# Patient Record
Sex: Female | Born: 1970
Health system: Southern US, Community
[De-identification: ages and names within clinical notes are randomized; demographics above are authoritative.]

## PROBLEM LIST (undated history)

## (undated) DIAGNOSIS — T7840XA Allergy, unspecified, initial encounter: Secondary | ICD-10-CM

## (undated) DIAGNOSIS — D649 Anemia, unspecified: Secondary | ICD-10-CM

## (undated) DIAGNOSIS — F410 Panic disorder [episodic paroxysmal anxiety] without agoraphobia: Secondary | ICD-10-CM

## (undated) DIAGNOSIS — F419 Anxiety disorder, unspecified: Secondary | ICD-10-CM

## (undated) DIAGNOSIS — G43109 Migraine with aura, not intractable, without status migrainosus: Secondary | ICD-10-CM

## (undated) DIAGNOSIS — K8020 Calculus of gallbladder without cholecystitis without obstruction: Secondary | ICD-10-CM

## (undated) HISTORY — DX: Calculus of gallbladder without cholecystitis without obstruction: K80.20

## (undated) HISTORY — DX: Anxiety disorder, unspecified: F41.9

## (undated) HISTORY — DX: Migraine with aura, not intractable, without status migrainosus: G43.109

## (undated) HISTORY — PX: CHOLECYSTECTOMY: SHX55

## (undated) HISTORY — DX: Allergy, unspecified, initial encounter: T78.40XA

---
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
## 1998-10-12 DIAGNOSIS — T7840XA Allergy, unspecified, initial encounter: Secondary | ICD-10-CM

## 1998-10-12 HISTORY — DX: Allergy, unspecified, initial encounter: T78.40XA

## 2003-12-31 ENCOUNTER — Ambulatory Visit: Payer: Self-pay | Admitting: Family Medicine

## 2004-12-14 DIAGNOSIS — F419 Anxiety disorder, unspecified: Secondary | ICD-10-CM

## 2004-12-14 HISTORY — DX: Anxiety disorder, unspecified: F41.9

## 2005-01-20 ENCOUNTER — Ambulatory Visit: Payer: Self-pay | Admitting: Family Medicine

## 2005-01-24 ENCOUNTER — Ambulatory Visit: Payer: Self-pay | Admitting: Family Medicine

## 2005-02-03 ENCOUNTER — Ambulatory Visit: Payer: Self-pay | Admitting: Family Medicine

## 2005-07-13 ENCOUNTER — Ambulatory Visit: Payer: Self-pay | Admitting: Family Medicine

## 2005-08-10 ENCOUNTER — Ambulatory Visit: Payer: Self-pay | Admitting: Family Medicine

## 2005-08-14 ENCOUNTER — Ambulatory Visit: Payer: Self-pay | Admitting: Family Medicine

## 2005-09-26 ENCOUNTER — Ambulatory Visit: Payer: Self-pay | Admitting: Family Medicine

## 2006-01-02 ENCOUNTER — Ambulatory Visit: Payer: Self-pay | Admitting: Family Medicine

## 2006-05-06 ENCOUNTER — Encounter: Payer: Self-pay | Admitting: Family Medicine

## 2006-05-06 DIAGNOSIS — J309 Allergic rhinitis, unspecified: Secondary | ICD-10-CM | POA: Insufficient documentation

## 2006-05-06 DIAGNOSIS — F411 Generalized anxiety disorder: Secondary | ICD-10-CM | POA: Insufficient documentation

## 2007-01-01 ENCOUNTER — Ambulatory Visit: Payer: Self-pay | Admitting: Family Medicine

## 2007-01-24 ENCOUNTER — Encounter: Payer: Self-pay | Admitting: Maternal & Fetal Medicine

## 2007-07-29 ENCOUNTER — Emergency Department: Payer: Self-pay | Admitting: Emergency Medicine

## 2007-08-02 ENCOUNTER — Inpatient Hospital Stay: Payer: Self-pay

## 2007-08-02 ENCOUNTER — Observation Stay: Payer: Self-pay | Admitting: Obstetrics and Gynecology

## 2008-04-30 ENCOUNTER — Telehealth: Payer: Self-pay | Admitting: Family Medicine

## 2008-10-16 ENCOUNTER — Ambulatory Visit: Payer: Self-pay | Admitting: Family Medicine

## 2008-10-16 DIAGNOSIS — J029 Acute pharyngitis, unspecified: Secondary | ICD-10-CM | POA: Insufficient documentation

## 2008-10-16 LAB — CONVERTED CEMR LAB: Rapid Strep: NEGATIVE

## 2008-10-17 ENCOUNTER — Encounter: Payer: Self-pay | Admitting: Family Medicine

## 2009-04-01 ENCOUNTER — Ambulatory Visit: Payer: Self-pay | Admitting: Obstetrics and Gynecology

## 2009-07-15 ENCOUNTER — Ambulatory Visit: Payer: Self-pay | Admitting: Family Medicine

## 2009-07-16 ENCOUNTER — Telehealth: Payer: Self-pay | Admitting: Family Medicine

## 2009-09-21 ENCOUNTER — Encounter (INDEPENDENT_AMBULATORY_CARE_PROVIDER_SITE_OTHER): Payer: Self-pay | Admitting: *Deleted

## 2010-02-25 ENCOUNTER — Ambulatory Visit
Admission: RE | Admit: 2010-02-25 | Discharge: 2010-02-25 | Payer: Self-pay | Source: Home / Self Care | Attending: Family Medicine | Admitting: Family Medicine

## 2010-02-25 DIAGNOSIS — J0110 Acute frontal sinusitis, unspecified: Secondary | ICD-10-CM | POA: Insufficient documentation

## 2010-03-15 NOTE — Progress Notes (Signed)
Summary: wants antibiotic  Phone Note Call from Patient Call back at [phone]   Caller: Patient Call For: Dr.Josafat Enrico Summary of Call: Pt was seen yesterday for possible strep and she is not feeling any better today.  She is asking for culture results but they arent back yet so she is asking if she can get an antibiotic to take through the week end.  She uses [facility] on [address]  Please let her know, leave message if she doesnt answer. Initial call taken by: Lowella Petties CMA,  July 16, 2009 3:45 PM    New/Updated Medications: PENICILLIN V POTASSIUM 500 MG TABS (PENICILLIN V POTASSIUM) 1 tab by mouth three times a day x 10 days Prescriptions: PENICILLIN V POTASSIUM 500 MG TABS (PENICILLIN V POTASSIUM) 1 tab by mouth three times a day x 10 days  #30 x 0   Entered and Authorized by:   Ruthe Mannan MD   Signed by:   Ruthe Mannan MD on 07/16/2009   Method used:   Electronically to        [facility]. [address] [phone]* (retail)       [address] [place]  [address]       Ph: [phone]       Fax: [phone]   RxID:   [id number]   Appended Document: wants antibiotic Patient advised as instructed via telephone.

## 2010-03-15 NOTE — Letter (Signed)
Summary: Nadara Eaton letter  [place] [facility]  [address] [place], [place] [address]   Phone: [phone]  Fax: [phone]       09/21/2009 MRN: [id number]  [facility] [address] [place], [place]  [address]  Dear Ms. Moorhouse,  [facility] [place], and [place] announce the retirement of Arta Silence, M.D., from full-time practice at the [facility] office effective August 12, 2009 and his plans of returning part-time.  It is important to Dr. Hetty Ely and to our practice that you understand that [facility] has seven physicians in our office for your health care needs.  We will continue to offer the same exceptional care that you have today.    Dr. Hetty Ely has spoken to many of you about his plans for retirement and returning part-time in the fall.   We will continue to work with you through the transition to schedule appointments for you in the office and meet the high standards that [facility] is committed to.   Again, it is with great pleasure that we share the news that Dr. Hetty Ely will return to [facility] in October of 2011 with a reduced schedule.    If you have any questions, or would like to request an appointment with one of our physicians, please call us at [phone] and press the option for Scheduling an appointment.  We take pleasure in providing you with excellent patient care and look forward to seeing you at your next office visit.  Our [facility] Physicians are:  Tillman Abide, M.D. Laurita Quint, M.D. Roxy Manns, M.D. Kerby Nora, M.D. Hannah Beat, M.D. Ruthe Mannan, M.D. We proudly welcomed Raechel Ache, M.D. and Eustaquio Boyden, M.D. to the practice in July/August 2011.  Sincerely,  [facility]

## 2010-03-15 NOTE — Assessment & Plan Note (Signed)
Summary: ST/CLE   Vital Signs:  Patient profile:   40 year old female Height:      65 inches Weight:      164 pounds BMI:     27.39 Temp:     97.9 degrees F oral Pulse rate:   84 / minute Pulse rhythm:   regular BP sitting:   132 / 76  (left arm) Cuff size:   regular  Vitals Entered By: Liane Comber CMA Duncan Dull) (October 16, 2008 11:34 AM)  History of Present Illness: was out of town last weekend  woke up on sunday with sore throat    (she has chronic sinus problems) hurts to swallow sore on sides of neck some thick mucuos with blood from her nose  has not felt feverish   does have allergies  thick and stuffy in nose  sinus pain above eyes ears are hurting  also some pain in back of head     Allergies: No Known Drug Allergies  Past History:  Past Medical History: Last updated: 05/06/2006 Allergic rhinitis 10/12/1998 Anxiety 12/14/2004  Past Surgical History: Last updated: 05/06/2006 NBSVDx2 Appendectomy 1987  Risk Factors: Smoking Status: current (05/06/2006) Packs/Day: 1/2 to 1 (05/06/2006)  Review of Systems General:  Complains of fever and malaise. Eyes:  Complains of discharge; a littl discharge . ENT:  Complains of earache, nasal congestion, postnasal drainage, sinus pressure, and sore throat; denies ear discharge. CV:  Denies chest pain or discomfort and palpitations. Resp:  Complains of cough; little bad cough- not bad . GI:  Denies diarrhea, nausea, and vomiting. MS:  Denies joint pain. Derm:  Denies insect bite(s), itching, lesion(s), and rash.  Physical Exam  General:  well appearing but fatigued  Head:  normocephalic, atraumatic, and no abnormalities observed.   tender ethmoid and maxillary sinus tenderness  Eyes:  vision grossly intact, pupils equal, pupils round, pupils reactive to light, and no injection.   Ears:  R ear normal and L ear normal.   Nose:  nares are mildly injected and congested Mouth:  throat is erythematous  some  clear post nasal drip no swelling or exudate  Neck:  No deformities, masses, or tenderness noted. Chest Wall:  No deformities, masses, or tenderness noted. Lungs:  Normal respiratory effort, chest expands symmetrically. Lungs are clear to auscultation, no crackles or wheezes. Heart:  Normal rate and regular rhythm. S1 and S2 normal without gallop, murmur, click, rub or other extra sounds. Abdomen:  soft and non-tender.   Msk:  no acute joint changes  Skin:  Intact without suspicious lesions or rashes Cervical Nodes:  No lymphadenopathy noted Psych:  normal affect, talkative and pleasant    Impression & Recommendations:  Problem # 1:  SORE THROAT (ICD-462) Assessment New  with sinus pain and pressure and some drainage  rapid strep neg- but quite erythematous on exam  will cover with augmentin - get throat cx update if worse or not imp recommend sympt care- see pt instructions   Her updated medication list for this problem includes:    Augmentin 875-125 Mg Tabs (Amoxicillin-pot clavulanate) .Marland Kitchen... 1 by mouth two times a day for 10 days  Orders: T-Culture, Throat ([address]29-52841) Rapid Strep (324[address])  Complete Medication List: 1)  Zoloft 25 Mg Tabs (Sertraline hcl) .... 1/2-1 tab by mouth qd 2)  Augmentin 875-125 Mg Tabs (Amoxicillin-pot clavulanate) .Marland Kitchen.. 1 by mouth two times a day for 10 days 3)  Seroquel 25 Mg Tabs (Quetiapine fumarate) .Marland Kitchen.. 1 by mouth at  bedtime 4)  Klonopin 0.5 Mg Tabs (Clonazepam) .... 1/2 once daily as needed, 1 by mouth at bedtime  Patient Instructions: 1)  I am sending throat culture - will update when it returns  2)  tylenol as needed for throat pain/ chloraseptic spray  3)  robitussin or mucinex for congestion - lots of fluids 4)  update me if wose or fever  5)  take augmentin as directed  Prescriptions: AUGMENTIN 875-125 MG TABS (AMOXICILLIN-POT CLAVULANATE) 1 by mouth two times a day for 10 days  #20 x 0    Entered and Authorized by:   Judith Part  MD   Signed by:   Judith Part MD on 10/16/2008   Method used:   Print then Give to Patient   RxID:   [id number]   Prior Medications (reviewed today): ZOLOFT 25 MG  TABS (SERTRALINE HCL) 1/2-1 tab by mouth qd AUGMENTIN 875-125 MG TABS (AMOXICILLIN-POT CLAVULANATE) 1 by mouth two times a day for 10 days SEROQUEL 25 MG TABS (QUETIAPINE FUMARATE) 1 by mouth at bedtime KLONOPIN 0.5 MG TABS (CLONAZEPAM) 1/2 once daily as needed, 1 by mouth at bedtime Current Allergies (reviewed today): No known allergies  Current Medications (including changes made in today's visit):  ZOLOFT 25 MG  TABS (SERTRALINE HCL) 1/2-1 tab by mouth qd AUGMENTIN 875-125 MG TABS (AMOXICILLIN-POT CLAVULANATE) 1 by mouth two times a day for 10 days SEROQUEL 25 MG TABS (QUETIAPINE FUMARATE) 1 by mouth at bedtime KLONOPIN 0.5 MG TABS (CLONAZEPAM) 1/2 once daily as needed, 1 by mouth at bedtime   Laboratory Results  Date/Time Received: October 16, 2008 11:35 AM Date/Time Reported: October 16, 2008 11:35 AM  Other Tests  Rapid Strep: negative  Kit Test Internal QC: Positive   (Normal Range: Negative)

## 2010-03-15 NOTE — Assessment & Plan Note (Signed)
Summary: STREP??   Vital Signs:  Patient profile:   40 year old female Height:      65 inches Weight:      163.25 pounds BMI:     27.26 Temp:     98.7 degrees F oral Pulse rate:   72 / minute Pulse rhythm:   regular BP sitting:   150 / 80  (left arm) Cuff size:   regular  Vitals Entered By: Linde Gillis CMA Duncan Dull) (July 15, 2009 12:02 PM) CC: sore throat   History of Present Illness: 40 yo here for for ? strep throat.  Son has strep throat. Woke up with morning with sore throat and headache. No fevers, chills, nausea or vomiting. No rash or abdominal pain. No difficulty swallowing. No ear pain. No cough or runny nose.   Taking Ibuprofen which is helping with symptoms.  PMH consistent with Group B strep- see throat culture from 10/2008.  Current Medications (verified): 1)  Zoloft 25 Mg  Tabs (Sertraline Hcl) .... 1/2-1 Tab By Mouth Qd 2)  Seroquel 25 Mg Tabs (Quetiapine Fumarate) .Marland Kitchen.. 1 By Mouth At Bedtime 3)  Klonopin 0.5 Mg Tabs (Clonazepam) .... 1/2 Once Daily As Needed, 1 By Mouth At Bedtime  Allergies (verified): No Known Drug Allergies  Review of Systems      See HPI General:  Denies fever. ENT:  Denies difficulty swallowing. Resp:  Denies cough.  Physical Exam  General:  well appearing but fatigued  Mouth:  throat is erythematous  some clear post nasal drip no swelling or exudate  Lungs:  Normal respiratory effort, chest expands symmetrically. Lungs are clear to auscultation, no crackles or wheezes. Heart:  Normal rate and regular rhythm. S1 and S2 normal without gallop, murmur, click, rub or other extra sounds. Psych:  normal affect, talkative and pleasant    Impression & Recommendations:  Problem # 1:  SORE THROAT (ICD-462) Assessment New Rapid strep negative but given her past history of GBS (see emr) and son's strep throat diagnosis, will send for culture. The following medications were removed from the medication list:    Augmentin 875-125  Mg Tabs (Amoxicillin-pot clavulanate) .Marland Kitchen... 1 by mouth two times a day for 10 days  Orders: Rapid Strep ([address]) T-Culture, Throat ([id number])  Complete Medication List: 1)  Zoloft 25 Mg Tabs (Sertraline hcl) .... 1/2-1 tab by mouth qd 2)  Seroquel 25 Mg Tabs (Quetiapine fumarate) .Marland Kitchen.. 1 by mouth at bedtime 3)  Klonopin 0.5 Mg Tabs (Clonazepam) .... 1/2 once daily as needed, 1 by mouth at bedtime  Current Allergies (reviewed today): No known allergies

## 2010-03-15 NOTE — Progress Notes (Signed)
Summary: problems with allergies  Phone Note Call from Patient Call back at Home Phone [phone] Call back at [phone]   Caller: Patient Call For: Shaune Leeks MD Summary of Call: Pt is having a bad time with allergies and she is asking what she can take with the other meds that she takes.  She takes zoloft, seroquil, klonopin.  Uses [facility] [address] Please advise. Initial call taken by: Lowella Petties,  April 30, 2008 3:38 PM  Follow-up for Phone Call        Suggest Claritin, Allegra or Zyrtec or their generics. Suggest first two in AM, latter in PM. If not enough to control sxs in 7-10 days to get a blood level, come in but don't stop the Claritin, Allegra or Zyrtec. Follow-up by: Shaune Leeks MD,  April 30, 2008 4:34 PM  Additional Follow-up for Phone Call Additional follow up Details #1::        Advised pt. Additional Follow-up by: Lowella Petties,  April 30, 2008 4:44 PM

## 2010-03-15 NOTE — Assessment & Plan Note (Signed)
Summary: DISCUSS MEDICATION/CLE  Medications Added ALPRAZOLAM 0.5 MG  TABS (ALPRAZOLAM) three times a day PRN ZOLOFT 25 MG  TABS (SERTRALINE HCL) 1 Q O D ZOLOFT 25 MG  TABS (SERTRALINE HCL) 1/2-1 tab by mouth qd      Allergies Added: NKDA  Vital Signs:  Patient Profile:   40 Years Old Female Weight:      167 pounds Temp:     98.9 degrees F oral Pulse rate:   68 / minute Pulse rhythm:   regular BP sitting:   160 / 70  (left arm) Cuff size:   regular  Vitals Entered ByProvidence Crosby (January 01, 2007 12:41 PM)                 Chief Complaint:  DISCUSS ZOLOFT.  History of Present Illness: BP at OB/GYN last week 124/65 and at home 115/65. Pt is pregnant  and is somewhat shocked. Has discussed with Dr Greggory Keen and has been tapering but probably too quickly as she has not felt well since trying to go to 25mg  every other day...went frm 50mg  once daily to 50 altern w/ 25 then 25then 25 every other day, her present dose. Having morning sickness and some familial stress as well.  Mother in law died this year on baby's due date next year.  Current Allergies: No known allergies       Physical Exam  General:     Well-developed,well-nourished,in no acute distress; alert,appropriate and cooperative throughout examination Head:     Normocephalic and atraumatic without obvious abnormalities. No apparent alopecia or balding. Eyes:     Conjunctiva clear bilaterally.  Ears:     External ear exam shows no significant lesions or deformities.  Otoscopic examination reveals clear canals, tympanic membranes are intact bilaterally without bulging, retraction, inflammation or discharge. Hearing is grossly normal bilaterally. Nose:     External nasal examination shows no deformity or inflammation. Nasal mucosa are pink and moist without lesions or exudates. Mouth:     Oral mucosa and oropharynx without lesions or exudates.  Teeth in good repair.    Impression &  Recommendations:  Problem # 1:  ANXIETY (ICD-300.00) Now that pregnant, want to get off SSRI an will more slowly taper The following medications were removed from the medication list:    Zoloft 25 Mg Tabs (Sertraline hcl) .Marland Kitchen... 1 q o d Take 25 once daily for two weks, 25 altn w/ 1/2 25 for two weeks, 1/2 25 once daily for two weeks, 1/2 altn w/ 0 for two weeks and then stop.  Her updated medication list for this problem includes:    Alprazolam 0.5 Mg Tabs (Alprazolam) .Marland Kitchen... Three times a day prn    Zoloft 25 Mg Tabs (Sertraline hcl) .Marland Kitchen... 1/2-1 tab by mouth qd  The following medications were removed from the medication list:    Zoloft 25 Mg Tabs (Sertraline hcl) .Marland Kitchen... 1 q o d  Her updated medication list for this problem includes:    Alprazolam 0.5 Mg Tabs (Alprazolam) .Marland Kitchen... Three times a day prn    Zoloft 25 Mg Tabs (Sertraline hcl) .Marland Kitchen... 1/2-1 tab by mouth qd   Complete Medication List: 1)  Alprazolam 0.5 Mg Tabs (Alprazolam) .... Three times a day prn 2)  Zoloft 25 Mg Tabs (Sertraline hcl) .... 1/2-1 tab by mouth qd   Patient Instructions: 1)  Start slower taper of Zoloft. 2)  RTC as needed     Prescriptions: ZOLOFT 25 MG  TABS (SERTRALINE HCL)  1/2-1 tab by mouth qd  #30 x 1   Entered and Authorized by:   Shaune Leeks MD   Signed by:   Shaune Leeks MD on 01/01/2007   Method used:   Print then Give to Patient   RxID:   [id number]  ]

## 2010-03-17 NOTE — Assessment & Plan Note (Signed)
Summary: ST,EARS,CONGESTION,HA/CLE   Vital Signs:  Patient profile:   40 year old female Height:      65 inches Weight:      177 pounds BMI:     29.56 Temp:     98.2 degrees F oral Pulse rate:   80 / minute Pulse rhythm:   regular BP sitting:   160 / 88  (left arm) Cuff size:   regular  Vitals Entered By: Delilah Shan CMA Duncan Dull) (February 25, 2010 9:09 AM)  Serial Vital Signs/Assessments:  Time      Position  BP       Pulse  Resp  Temp     By                     140/82                         Crawford Givens MD  CC: ST, ears, congestion, headache   History of Present Illness: 1 week ago with sinus pain.  Still with HA over the weekend.  Son has been sick.  Since then pain with swallowing.  neck is sore.  No fevers known.  + sweats.  No sputum, but some post nasal gtt.  Nasal congestion.  Minimal cough.  voice change.  bilateral, R>L ear pain.  Frontal > max pain.  Some mandible pain bilaterally.  Achy all over, typical for patient with an illness.    Current Medications (verified): 1)  Zoloft 100 Mg Tabs (Sertraline Hcl) .Marland Kitchen.. 1 By Mouth Once Daily 2)  Seroquel 25 Mg Tabs (Quetiapine Fumarate) .Marland Kitchen.. 1 By Mouth At Bedtime 3)  Klonopin 0.5 Mg Tabs (Clonazepam) .... 1/2 Once Daily As Needed, 1 By Mouth At Bedtime  Allergies (verified): 1)  ! 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  Otherwise negative.    Physical Exam  General:  GEN: nad, alert and oriented HEENT: mucous membranes moist, TM w/o erythema, nasal epithelium injected, OP with cobblestoning, frontal sinuses tender to palpation  NECK: supple w/o LA CV:  rrr. PULM: ctab, no inc wob ABD: soft, +bs EXT: no edema    Impression & Recommendations:  Problem # 1:  SINUSITIS - ACUTE-NOS (ICD-461.9) Nontoxic.  Start amoxil and supportive tx o/w.  follow up as needed.  She agrees.   The following medications were removed from the medication list:    Penicillin V Potassium 500 Mg Tabs (Penicillin v potassium) .Marland Kitchen... 1 tab by mouth three times a day x 10 days Her updated medication list for this problem includes:    Amoxicillin 875 Mg Tabs (Amoxicillin) .Marland Kitchen... 1 by mouth two times a day  Complete Medication List: 1)  Zoloft 100 Mg Tabs (Sertraline hcl) .Marland Kitchen.. 1 by mouth once daily 2)  Seroquel 25 Mg Tabs (Quetiapine fumarate) .Marland Kitchen.. 1 by mouth at bedtime 3)  Klonopin 0.5 Mg Tabs (Clonazepam) .... 1/2 once daily as needed, 1 by mouth at bedtime 4)  Amoxicillin 875 Mg Tabs (Amoxicillin) .Marland Kitchen.. 1 by mouth two times a day  Patient Instructions: 1)  Get plenty of rest, drink lots of clear  liquids, and use Tylenol or Ibuprofen for fever and comfort. Start the antibiotics today and try to quit smoking.  I would gargle with warm salt water for your throat.  Take care.  Prescriptions: AMOXICILLIN 875 MG TABS (AMOXICILLIN) 1 by mouth two times a day  #20 x 0   Entered and Authorized by:   Crawford Givens MD   Signed by:   Crawford Givens MD on 02/25/2010   Method used:   Print then Give to Patient   RxID:   [id number]    Orders Added: 1)  Est. Patient Level III [14782]    Current Allergies (reviewed today): ! * LATEX

## 2010-07-01 NOTE — Letter (Signed)
January 02, 2006    Sharon Seller, M.D.  [facility], OB-GYN Department  [address]  [place], [place] [address]   RE:  Maria Lawson, Maria Lawson  MRN:  [id number]  /  DOB:  11-Mar-1970   Dear Sharl Ma,   This is to introduce Claire Dolores, a 40 year old white female who is  extremely anxious, who has had spotting for a week and now with left lower  quadrant pelvic pain.  The pain appears to be fairly mild.  She has no known  pregnancy, although she does nothing to avoid pregnancy.  She has not had  any feelings of this, has been pregnant and has delivered in the past.  She  hasn't been seen by gynecology in 12 years, since the delivery of her last  child, which was done by a gynecologist at [facility].  She is  reasonably healthy, other than her extreme anxiety.  She is pretty sure this  is vaginal bleeding and looks healthy today.  Because she hasn't had  screening in quite some time, and I really haven't ever done a pelvic or Pap  smear on her myself, I felt she ought to be seen to start evaluation with  you.   Her past medical history otherwise is benign.  She is on Zoloft 50 mg a day,  has been on Xanax but hasn't taken it in the last three months or so.  She  has NO DRUG ALLERGIES and otherwise is really healthy.   I appreciate your seeing her and I look forward to your evaluation.    Sincerely,     Arta Silence, MD  Electronically Signed   RNS/MedQ  DD: 01/02/2006  DT: 01/02/2006  Job #: [id number]

## 2012-06-27 ENCOUNTER — Encounter: Payer: Self-pay | Admitting: Family Medicine

## 2012-06-28 ENCOUNTER — Ambulatory Visit (INDEPENDENT_AMBULATORY_CARE_PROVIDER_SITE_OTHER): Payer: BC Managed Care – PPO | Admitting: Family Medicine

## 2012-06-28 ENCOUNTER — Encounter: Payer: Self-pay | Admitting: Family Medicine

## 2012-06-28 VITALS — BP 144/78 | HR 96 | Temp 98.4°F | Wt 207.0 lb

## 2012-06-28 DIAGNOSIS — J019 Acute sinusitis, unspecified: Secondary | ICD-10-CM

## 2012-06-28 DIAGNOSIS — J020 Streptococcal pharyngitis: Secondary | ICD-10-CM

## 2012-06-28 LAB — POCT RAPID STREP A (OFFICE): Rapid Strep A Screen: NEGATIVE

## 2012-06-28 MED ORDER — AMOXICILLIN-POT CLAVULANATE 875-125 MG PO TABS: 1.0000 | ORAL_TABLET | Freq: Two times a day (BID) | ORAL | Status: DC

## 2012-06-28 NOTE — Progress Notes (Signed)
Started with sx about 12 days ago. First noted nasal congestion and ST.  Since then with cough, waking with postnasal gtt. Sinus pressure (this isn't uncommon for her).  "I just don't feel well. She was prev possibly improving, but then the sx worsened again. Smoker, contemplative about cessation, discussed.  Upper teeth are sore.    Meds, vitals, and allergies reviewed.   ROS: See HPI.  Otherwise, noncontributory.  GEN: nad, alert and oriented HEENT: mucous membranes moist, tm w/o erythema, nasal exam w/o erythema, clear discharge noted,  OP with cobblestoning, frontal sinuses ttp B NECK: supple w/o LA CV: rrr.   PULM: ctab, no inc wob EXT: no edema

## 2012-06-28 NOTE — Patient Instructions (Addendum)
Start the antibiotics today and this should gradually improve.  Take care.  Drink plenty of fluids.

## 2012-06-30 NOTE — Assessment & Plan Note (Signed)
Start augmentin, fu prn. Nontoxic.  D/w pt about supportive tx o/w.  She agrees.

## 2013-08-06 ENCOUNTER — Telehealth: Payer: Self-pay | Admitting: Family Medicine

## 2013-08-06 NOTE — Telephone Encounter (Signed)
Patient returned your call.

## 2013-12-15 ENCOUNTER — Ambulatory Visit: Payer: BC Managed Care – PPO | Admitting: Family Medicine

## 2013-12-18 ENCOUNTER — Ambulatory Visit (INDEPENDENT_AMBULATORY_CARE_PROVIDER_SITE_OTHER): Payer: Commercial Managed Care - PPO | Admitting: Family Medicine

## 2013-12-18 ENCOUNTER — Encounter: Payer: Self-pay | Admitting: Family Medicine

## 2013-12-18 VITALS — BP 160/80 | HR 99 | Temp 98.6°F | Wt 216.0 lb

## 2013-12-18 DIAGNOSIS — R1084 Generalized abdominal pain: Secondary | ICD-10-CM

## 2013-12-18 DIAGNOSIS — R195 Other fecal abnormalities: Secondary | ICD-10-CM

## 2013-12-18 LAB — CBC WITH DIFFERENTIAL/PLATELET
Basophils Absolute: 0 10*3/uL (ref 0.0–0.1)
Basophils Relative: 0.6 % (ref 0.0–3.0)
Eosinophils Absolute: 0.1 10*3/uL (ref 0.0–0.7)
Eosinophils Relative: 1.2 % (ref 0.0–5.0)
HCT: 42.7 % (ref 36.0–46.0)
Hemoglobin: 14.1 g/dL (ref 12.0–15.0)
Lymphocytes Relative: 18.6 % (ref 12.0–46.0)
Lymphs Abs: 1.6 10*3/uL (ref 0.7–4.0)
MCHC: 33.1 g/dL (ref 30.0–36.0)
MCV: 85.6 fl (ref 78.0–100.0)
Monocytes Absolute: 0.5 10*3/uL (ref 0.1–1.0)
Monocytes Relative: 5.7 % (ref 3.0–12.0)
Neutro Abs: 6.3 10*3/uL (ref 1.4–7.7)
Neutrophils Relative %: 73.9 % (ref 43.0–77.0)
Platelets: 270 10*3/uL (ref 150.0–400.0)
RBC: 4.99 Mil/uL (ref 3.87–5.11)
RDW: 12.8 % (ref 11.5–15.5)
WBC: 8.5 10*3/uL (ref 4.0–10.5)

## 2013-12-18 LAB — COMPREHENSIVE METABOLIC PANEL
ALT: 17 U/L (ref 0–35)
AST: 15 U/L (ref 0–37)
Albumin: 4 g/dL (ref 3.5–5.2)
Alkaline Phosphatase: 65 U/L (ref 39–117)
BUN: 6 mg/dL (ref 6–23)
CO2: 26 mEq/L (ref 19–32)
Calcium: 9.4 mg/dL (ref 8.4–10.5)
Chloride: 102 mEq/L (ref 96–112)
Creatinine, Ser: 0.8 mg/dL (ref 0.4–1.2)
GFR: 84.99 mL/min (ref >60.00)
Glucose, Bld: 87 mg/dL (ref 70–99)
Potassium: 3.9 mEq/L (ref 3.5–5.1)
Sodium: 137 mEq/L (ref 135–145)
Total Bilirubin: 0.5 mg/dL (ref 0.2–1.2)
Total Protein: 7.6 g/dL (ref 6.0–8.3)

## 2013-12-18 NOTE — Progress Notes (Signed)
Pre visit review using our clinic review tool, if applicable. No additional management support is needed unless otherwise documented below in the visit note.  Longstanding loose stools.  Fecal urgency after meals.  That is her baseline.  Then she'll have occ episodes with back pain, abd burning and back pain near the R shoulder blade and pain near the R hip.  Those episodes can last a few days.  She'll have a lot of gas with those episodes.  She had one recently, but is some better now.    No clear trigger.  No food triggers.  She hasn't done a food diary yet.    No fever, chills, no blood in stool.  No black stools. She has episodic yellow stools during the "bad episodes."  No vomiting, but some nausea.  H/o appendectomy.   No jaundice.  She has some occ RUQ pain.  No burning with urination.    No FH of GI illness.    Meds, vitals, and allergies reviewed.   ROS: See HPI.  Otherwise, noncontributory.  GEN: nad, alert and oriented HEENT: mucous membranes moist NECK: supple w/o LA CV: rrr.  no murmur PULM: ctab, no inc wob, abd not ttp today on exam.  ABD: soft, +bs EXT: no edema SKIN: no acute rash

## 2013-12-18 NOTE — Patient Instructions (Signed)
Go to the lab on the way out.  We'll contact you with your lab report. Keep a food diary to try to ID some triggers.  Maria Lawson will call about your ultrasound.

## 2013-12-19 ENCOUNTER — Ambulatory Visit: Payer: Self-pay | Admitting: Family Medicine

## 2013-12-19 DIAGNOSIS — R195 Other fecal abnormalities: Secondary | ICD-10-CM | POA: Insufficient documentation

## 2013-12-19 NOTE — Assessment & Plan Note (Signed)
Longstanding loose stools.  Fecal urgency after meals.  That is her baseline. She'll have occ episodes with back pain and yellow stools.  Unclear if she has two issues, ie IBS at baseline with occ flare of GB sx.   D/w pt.   Check basic labs today and then proceed with abd u/s.  Check IFOB. If unremarkable, consider GI referral vs celiac testing, etc.  She is going to start a food diary to look for offending agents.  Okay for outpatient f/u.

## 2013-12-22 ENCOUNTER — Encounter: Payer: Self-pay | Admitting: Family Medicine

## 2013-12-26 ENCOUNTER — Other Ambulatory Visit (INDEPENDENT_AMBULATORY_CARE_PROVIDER_SITE_OTHER): Payer: Commercial Managed Care - PPO

## 2013-12-26 DIAGNOSIS — R1084 Generalized abdominal pain: Secondary | ICD-10-CM

## 2013-12-26 LAB — FECAL OCCULT BLOOD, IMMUNOCHEMICAL: Fecal Occult Bld: NEGATIVE

## 2013-12-29 ENCOUNTER — Encounter: Payer: Self-pay | Admitting: *Deleted

## 2014-01-28 DIAGNOSIS — F4329 Adjustment disorder with other symptoms: Secondary | ICD-10-CM | POA: Insufficient documentation

## 2015-02-01 ENCOUNTER — Encounter: Payer: Self-pay | Admitting: Family Medicine

## 2015-02-01 ENCOUNTER — Telehealth: Payer: Self-pay | Admitting: Family Medicine

## 2015-02-01 ENCOUNTER — Ambulatory Visit (INDEPENDENT_AMBULATORY_CARE_PROVIDER_SITE_OTHER): Payer: BLUE CROSS/BLUE SHIELD | Admitting: Family Medicine

## 2015-02-01 VITALS — BP 150/90 | HR 85 | Temp 98.0°F | Ht 66.0 in | Wt 225.8 lb

## 2015-02-01 DIAGNOSIS — IMO0001 Reserved for inherently not codable concepts without codable children: Secondary | ICD-10-CM

## 2015-02-01 DIAGNOSIS — Z0000 Encounter for general adult medical examination without abnormal findings: Secondary | ICD-10-CM

## 2015-02-01 DIAGNOSIS — R030 Elevated blood-pressure reading, without diagnosis of hypertension: Secondary | ICD-10-CM

## 2015-02-01 DIAGNOSIS — F411 Generalized anxiety disorder: Secondary | ICD-10-CM

## 2015-02-01 DIAGNOSIS — Z7189 Other specified counseling: Secondary | ICD-10-CM

## 2015-02-01 LAB — LIPID PANEL
Cholesterol: 202 mg/dL — ABNORMAL HIGH (ref 0–200)
HDL: 50.3 mg/dL (ref >39.00)
LDL Cholesterol: 118 mg/dL — ABNORMAL HIGH (ref 0–99)
NonHDL: 151.21
Total CHOL/HDL Ratio: 4
Triglycerides: 165 mg/dL — ABNORMAL HIGH (ref 0.0–149.0)
VLDL: 33 mg/dL (ref 0.0–40.0)

## 2015-02-01 LAB — BASIC METABOLIC PANEL
BUN: 8 mg/dL (ref 6–23)
CO2: 28 mEq/L (ref 19–32)
Calcium: 9.6 mg/dL (ref 8.4–10.5)
Chloride: 100 mEq/L (ref 96–112)
Creatinine, Ser: 0.79 mg/dL (ref 0.40–1.20)
GFR: 84.53 mL/min (ref >60.00)
Glucose, Bld: 107 mg/dL — ABNORMAL HIGH (ref 70–99)
Potassium: 3.9 mEq/L (ref 3.5–5.1)
Sodium: 137 mEq/L (ref 135–145)

## 2015-02-01 MED ORDER — FEXOFENADINE HCL 180 MG PO TABS: 180.0000 mg | ORAL_TABLET | Freq: Every day | ORAL | Status: DC

## 2015-02-01 MED ORDER — SERTRALINE HCL 50 MG PO TABS
100.0000 mg | ORAL_TABLET | Freq: Every day | ORAL | Status: DC
Start: 2015-02-01 — End: 2016-01-26

## 2015-02-01 NOTE — Progress Notes (Signed)
Pre visit review using our clinic review tool, if applicable. No additional management support is needed unless otherwise documented below in the visit note.  CPE- See plan.  Routine anticipatory guidance given to patient.  See health maintenance. Tetanus deferred for now.  BP was a bigger issue today.   Encouraged vaccination.  Flu deferred for now.  BP was a bigger issue today.  Encouraged vaccination.  PNA and shingles not due.   Colon cancer screening.   Breast cancer screening encouraged.  DXA not due.  Pap smear last done years ago.  Never had an abnormal.  Deferred for now.   Encouraged.  Living will d/w pt.  Husband designated if patient were incapacitated.  Diet and exercise d/w pt.  "Horrible."   She wants to work on diet first, with calorie counting.  Encouraged.   HIV prev neg ~2008 per patient report.   BP had been ~130s/80s at home.  She is white coat hypertension.  No CP, SOB, BLE edema.  She has noted some palpitations, ie skipped beats. This is long standing.  She has had more migraines recently, with an aura.  She used to have 1/year and now has up to ~1 a month.    [facility]    PMH and SH reviewed  Meds, vitals, and allergies reviewed.   ROS: See HPI.  Otherwise negative.    GEN: nad, alert and oriented HEENT: mucous membranes moist NECK: supple w/o LA CV: rrr. PULM: ctab, no inc wob ABD: soft, +bs EXT: no edema SKIN: no acute rash Waist 48"

## 2015-02-01 NOTE — Patient Instructions (Addendum)
You can call for a mammogram: [facility] at [facility].  [address] [place] [id number] [phone]  [facility]   Get a flu and tetanus shot.   Check your BP with your cuff and another cuff to make sure you get good readings.   Update me if your sugar is consistently >140/>90. Go to the lab on the way out.  We'll contact you with your lab report. Take care.  Glad to see you.

## 2015-02-01 NOTE — Telephone Encounter (Signed)
Husband called regarding the status of the form that needs to be filled out. Please call husband to let him know when the form is ready  cb number is [phone] Thank you

## 2015-02-02 DIAGNOSIS — R030 Elevated blood-pressure reading, without diagnosis of hypertension: Secondary | ICD-10-CM | POA: Insufficient documentation

## 2015-02-02 DIAGNOSIS — Z0000 Encounter for general adult medical examination without abnormal findings: Secondary | ICD-10-CM | POA: Insufficient documentation

## 2015-02-02 DIAGNOSIS — Z7189 Other specified counseling: Secondary | ICD-10-CM | POA: Insufficient documentation

## 2015-02-02 NOTE — Telephone Encounter (Signed)
Form in your In Box. 

## 2015-02-02 NOTE — Assessment & Plan Note (Addendum)
Tetanus deferred for now.  BP was a bigger issue today.   Encouraged vaccination.  Flu deferred for now.  BP was a bigger issue today.  Encouraged vaccination.  PNA and shingles not due.   Colon cancer screening.   Breast cancer screening encouraged.  DXA not due.  Pap smear last done years ago.  Never had an abnormal.  Deferred for now per patient request.  Encouraged.  Living will d/w pt.  Husband designated if patient were incapacitated.  Diet and exercise d/w pt.  "Horrible."   She wants to work on diet first, with calorie counting.  Encouraged.   HIV prev neg ~2008 per patient report.  She needs work forms done.  I'll fill them out if she'll send them to me.  The form that was sent in was not the form for me to fill out.

## 2015-02-02 NOTE — Telephone Encounter (Signed)
Husband contacted and will fax form directly to my fax machine.

## 2015-02-02 NOTE — Assessment & Plan Note (Signed)
[facility]  D/w pt.  tx for anxiety may help migraines, palpitations, and white coat htn.

## 2015-02-02 NOTE — Assessment & Plan Note (Signed)
She'll check at home.  Needs work on diet and exercise. Update me if persistently elevated.

## 2015-02-02 NOTE — Telephone Encounter (Signed)
They didn't fax the correct form.  If they send the form for me to fill out, then I'll do it.

## 2015-02-03 NOTE — Telephone Encounter (Signed)
Form done.  See notes on labs.  Thanks.

## 2015-02-03 NOTE — Telephone Encounter (Signed)
Called patient with lab results and was advised that the form was due Monday and does not think that it needs to be faxed now. Patient stated that she will check with her husband and see if it still needs to be faxed and will call back and advise and confirm fax number that it needs to go to. Form is in [employer] in box.

## 2015-02-10 NOTE — Telephone Encounter (Signed)
Faxed form to number that was given as a correction to what was printed on the form.  Form sent for scanning.

## 2015-02-19 ENCOUNTER — Telehealth: Payer: Self-pay | Admitting: Family Medicine

## 2015-02-19 NOTE — Telephone Encounter (Signed)
Do you happen to know if this has been received and completed, as of yet?

## 2015-02-19 NOTE — Telephone Encounter (Signed)
Husband called to follow up on the health information form for work. Fax - [phone] cb number is [phone]

## 2015-02-19 NOTE — Telephone Encounter (Signed)
Husband called back, please re fax they have never received it.  Fax is [phone] Thanks

## 2015-02-19 NOTE — Telephone Encounter (Signed)
Per records, was faxed 02/03/15.

## 2015-02-22 NOTE — Telephone Encounter (Signed)
Pt's husband called stating the ppw that was given to be filled out and faxed to the employer last month was not received. He asked if they could just come and pick it up. We do not have it up front in the ppw bin. When ppw is ready to be picked up, please call [phone].

## 2015-02-22 NOTE — Telephone Encounter (Signed)
Husband advised. Left at front desk for pick up.

## 2015-03-18 ENCOUNTER — Ambulatory Visit (INDEPENDENT_AMBULATORY_CARE_PROVIDER_SITE_OTHER): Payer: BLUE CROSS/BLUE SHIELD | Admitting: Primary Care

## 2015-03-18 ENCOUNTER — Encounter: Payer: Self-pay | Admitting: Primary Care

## 2015-03-18 ENCOUNTER — Ambulatory Visit: Payer: BLUE CROSS/BLUE SHIELD | Admitting: Family Medicine

## 2015-03-18 VITALS — BP 156/82 | HR 106 | Temp 98.1°F | Ht 66.0 in | Wt 224.4 lb

## 2015-03-18 DIAGNOSIS — J019 Acute sinusitis, unspecified: Secondary | ICD-10-CM

## 2015-03-18 MED ORDER — AMOXICILLIN-POT CLAVULANATE 875-125 MG PO TABS: 1.0000 | ORAL_TABLET | Freq: Two times a day (BID) | ORAL | Status: DC

## 2015-03-18 NOTE — Patient Instructions (Signed)
Start Augmentin antibiotics. Take 1 tablet by mouth twice daily for 10 days.  Nasal Congestion: Try using Flonase (fluticasone) nasal spray. Instill 2 sprays in each nostril once daily.   Continue taking daily allegra.  Increase fluids and rest. Please call if no improvement in pressure in 3-4 days.  It was a pleasure meeting you!  Sinusitis, Adult Sinusitis is redness, soreness, and inflammation of the paranasal sinuses. Paranasal sinuses are air pockets within the bones of your face. They are located beneath your eyes, in the middle of your forehead, and above your eyes. In healthy paranasal sinuses, mucus is able to drain out, and air is able to circulate through them by way of your nose. However, when your paranasal sinuses are inflamed, mucus and air can become trapped. This can allow bacteria and other germs to grow and cause infection. Sinusitis can develop quickly and last only a short time (acute) or continue over a long period (chronic). Sinusitis that lasts for more than 12 weeks is considered chronic. CAUSES Causes of sinusitis include:  Allergies.  Structural abnormalities, such as displacement of the cartilage that separates your nostrils (deviated septum), which can decrease the air flow through your nose and sinuses and affect sinus drainage.  Functional abnormalities, such as when the small hairs (cilia) that line your sinuses and help remove mucus do not work properly or are not present. SIGNS AND SYMPTOMS Symptoms of acute and chronic sinusitis are the same. The primary symptoms are pain and pressure around the affected sinuses. Other symptoms include:  Upper toothache.  Earache.  Headache.  Bad breath.  Decreased sense of smell and taste.  A cough, which worsens when you are lying flat.  Fatigue.  Fever.  Thick drainage from your nose, which often is green and may contain pus (purulent).  Swelling and warmth over the affected sinuses. DIAGNOSIS Your  health care provider will perform a physical exam. During your exam, your health care provider may perform any of the following to help determine if you have acute sinusitis or chronic sinusitis:  Look in your nose for signs of abnormal growths in your nostrils (nasal polyps).  Tap over the affected sinus to check for signs of infection.  View the inside of your sinuses using an imaging device that has a light attached (endoscope). If your health care provider suspects that you have chronic sinusitis, one or more of the following tests may be recommended:  Allergy tests.  Nasal culture. A sample of mucus is taken from your nose, sent to a lab, and screened for bacteria.  Nasal cytology. A sample of mucus is taken from your nose and examined by your health care provider to determine if your sinusitis is related to an allergy. TREATMENT Most cases of acute sinusitis are related to a viral infection and will resolve on their own within 10 days. Sometimes, medicines are prescribed to help relieve symptoms of both acute and chronic sinusitis. These may include pain medicines, decongestants, nasal steroid sprays, or saline sprays. However, for sinusitis related to a bacterial infection, your health care provider will prescribe antibiotic medicines. These are medicines that will help kill the bacteria causing the infection. Rarely, sinusitis is caused by a fungal infection. In these cases, your health care provider will prescribe antifungal medicine. For some cases of chronic sinusitis, surgery is needed. Generally, these are cases in which sinusitis recurs more than 3 times per year, despite other treatments. HOME CARE INSTRUCTIONS  Drink plenty of water. Water helps  thin the mucus so your sinuses can drain more easily.  Use a humidifier.  Inhale steam 3-4 times a day (for example, sit in the bathroom with the shower running).  Apply a warm, moist washcloth to your face 3-4 times a day, or as  directed by your health care provider.  Use saline nasal sprays to help moisten and clean your sinuses.  Take medicines only as directed by your health care provider.  If you were prescribed either an antibiotic or antifungal medicine, finish it all even if you start to feel better. SEEK IMMEDIATE MEDICAL CARE IF:  You have increasing pain or severe headaches.  You have nausea, vomiting, or drowsiness.  You have swelling around your face.  You have vision problems.  You have a stiff neck.  You have difficulty breathing.   This information is not intended to replace advice given to you by your health care provider. Make sure you discuss any questions you have with your health care provider.   Document Released: 01/30/2005 Document Revised: 02/20/2014 Document Reviewed: 02/14/2011 Elsevier Interactive Patient Education [employer]

## 2015-03-18 NOTE — Progress Notes (Signed)
Subjective:    Patient ID: Maria Lawson, female    DOB: August 10, 1970, 45 y.o.   MRN: [id number]  HPI  Maria Lawson is a 45 year old female who presents today with a chief complaint of sinus pressure. She also reports headache, fatigue, dry cough, and wheezing. Her pressure and headache is located to maxillary sinuses, frontal lobe, and behind her nasal cavity. She has a history of sinusitis. Over the past 2 weeks she's had increased pressure to her sinuses and cannot clear anything from her nasal cavity. She's taken Allegra and nasal spray with temporary improvement. Denies fevers.  Review of Systems  Constitutional: Positive for fatigue. Negative for fever and chills.  HENT: Positive for congestion and sinus pressure. Negative for ear pain.   Respiratory: Positive for cough and wheezing.   Cardiovascular: Negative for chest pain.       Past Medical History  Diagnosis Date  . Allergy 10/12/1998  . Anxiety 12/14/2004  . Migraine with aura     Social History   Social History  . Marital Status: Married    Spouse Name: N/A  . Number of Children: 3  . Years of Education: N/A   Occupational History  . Former Programme researcher, broadcasting/film/video    Social History Main Topics  . Smoking status: Former Smoker -- 1.00 packs/day for 20 years    Types: Cigarettes  . Smokeless tobacco: Never Used  . Alcohol Use: 0.0 oz/week    0 Standard drinks or equivalent per week     Comment: rare  . Drug Use: No  . Sexual Activity: Not on file   Other Topics Concern  . Not on file   Social History Narrative   Married, 1992   3 kids    Past Surgical History  Procedure Laterality Date  . Appendectomy  1987  . Vaginal delivery      Family History  Problem Relation Age of Onset  . Hyperlipidemia Mother   . Hypertension Father   . Hyperlipidemia Father   . Colon cancer Neg Hx   . Breast cancer Neg Hx     Allergies  Allergen Reactions  . Latex     REACTION: rash    Current Outpatient  Prescriptions on File Prior to Visit  Medication Sig Dispense Refill  . fexofenadine (ALLEGRA) 180 MG tablet Take 1 tablet (180 mg total) by mouth daily.    . QUEtiapine (SEROQUEL) 25 MG tablet Take 25 mg by mouth at bedtime.   3  . sertraline (ZOLOFT) 50 MG tablet Take 2 tablets (100 mg total) by mouth daily.     No current facility-administered medications on file prior to visit.    BP 156/82 mmHg  Pulse 106  Temp(Src) 98.1 F (36.7 C) (Oral)  Ht  (1.676 m)  Wt 224 lb 6.4 oz (101.787 kg)  BMI 36.24 kg/m2  SpO2 98%  LMP 03/12/2015    Objective:   Physical Exam  Constitutional: She appears well-nourished.  HENT:  Right Ear: Tympanic membrane and ear canal normal.  Left Ear: Tympanic membrane and ear canal normal.  Nose: Mucosal edema present. Right sinus exhibits maxillary sinus tenderness and frontal sinus tenderness. Left sinus exhibits maxillary sinus tenderness and frontal sinus tenderness.  Mouth/Throat: Posterior oropharyngeal erythema present. No oropharyngeal exudate or posterior oropharyngeal edema.  Neck: Neck supple.  Cardiovascular: Normal rate and regular rhythm.   Pulmonary/Chest: Effort normal and breath sounds normal. She has no wheezes. She has no rales.  Lymphadenopathy:  She has no cervical adenopathy.  Skin: Skin is warm and dry.          Assessment & Plan:  Acute Sinusitis:  Sinus pressure x 1 month, increased pain and pressure over the past 2 weeks. No longer able to relieve mucous from nasal cavity. Exam with moderate tenderness and mild swelling to maxillary sinuses. Lungs clear, does not appear toxic. Due to duration, presentation, and exam, will treat for presumed bacterial involvement. RX for Augmentin 10 day course sent to pharmacy. Start Flonase, continue Careers adviser. Fluids, rest, return precautions provided.

## 2015-03-18 NOTE — Progress Notes (Signed)
Pre visit review using our clinic review tool, if applicable. No additional management support is needed unless otherwise documented below in the visit note. 

## 2015-03-19 ENCOUNTER — Ambulatory Visit: Payer: BLUE CROSS/BLUE SHIELD | Admitting: Family Medicine

## 2015-04-03 ENCOUNTER — Emergency Department: Payer: BLUE CROSS/BLUE SHIELD

## 2015-04-03 ENCOUNTER — Emergency Department
Admission: EM | Admit: 2015-04-03 | Discharge: 2015-04-03 | Disposition: A | Discharge status: Home / Self Care | Payer: BLUE CROSS/BLUE SHIELD | Attending: Emergency Medicine | Admitting: Emergency Medicine

## 2015-04-03 ENCOUNTER — Encounter: Payer: Self-pay | Admitting: Emergency Medicine

## 2015-04-03 DIAGNOSIS — Z87891 Personal history of nicotine dependence: Secondary | ICD-10-CM | POA: Diagnosis not present

## 2015-04-03 DIAGNOSIS — R062 Wheezing: Secondary | ICD-10-CM

## 2015-04-03 DIAGNOSIS — Z79899 Other long term (current) drug therapy: Secondary | ICD-10-CM | POA: Diagnosis not present

## 2015-04-03 DIAGNOSIS — R0602 Shortness of breath: Secondary | ICD-10-CM | POA: Diagnosis present

## 2015-04-03 DIAGNOSIS — J40 Bronchitis, not specified as acute or chronic: Secondary | ICD-10-CM | POA: Diagnosis not present

## 2015-04-03 DIAGNOSIS — Z91040 Latex allergy status: Secondary | ICD-10-CM | POA: Diagnosis not present

## 2015-04-03 DIAGNOSIS — Z792 Long term (current) use of antibiotics: Secondary | ICD-10-CM | POA: Diagnosis not present

## 2015-04-03 HISTORY — DX: Panic disorder (episodic paroxysmal anxiety): F41.0

## 2015-04-03 LAB — CBC
HCT: 41.4 % (ref 35.0–47.0)
Hemoglobin: 13.9 g/dL (ref 12.0–16.0)
MCH: 28.6 pg (ref 26.0–34.0)
MCHC: 33.7 g/dL (ref 32.0–36.0)
MCV: 84.9 fL (ref 80.0–100.0)
Platelets: 291 10*3/uL (ref 150–440)
RBC: 4.88 MIL/uL (ref 3.80–5.20)
RDW: 13 % (ref 11.5–14.5)
WBC: 11.9 10*3/uL — ABNORMAL HIGH (ref 3.6–11.0)

## 2015-04-03 LAB — BASIC METABOLIC PANEL
Anion gap: 8 (ref 5–15)
BUN: 9 mg/dL (ref 6–20)
CO2: 25 mmol/L (ref 22–32)
Calcium: 9 mg/dL (ref 8.9–10.3)
Chloride: 106 mmol/L (ref 101–111)
Creatinine, Ser: 0.74 mg/dL (ref 0.44–1.00)
GFR calc Af Amer: >60 mL/min (ref >60)
GFR calc non Af Amer: >60 mL/min (ref >60)
Glucose, Bld: 116 mg/dL — ABNORMAL HIGH (ref 65–99)
Potassium: 3.7 mmol/L (ref 3.5–5.1)
Sodium: 139 mmol/L (ref 135–145)

## 2015-04-03 LAB — TROPONIN I: Troponin I: <0.03 ng/mL (ref <0.031)

## 2015-04-03 MED ORDER — ALBUTEROL SULFATE HFA 108 (90 BASE) MCG/ACT IN AERS: 2.0000 | INHALATION_SPRAY | RESPIRATORY_TRACT | Status: DC | PRN

## 2015-04-03 MED ORDER — HYDROCOD POLST-CPM POLST ER 10-8 MG/5ML PO SUER
5.0000 mL | Freq: Once | ORAL | Status: AC
  Administered 2015-04-03: 5 mL via ORAL
  Filled 2015-04-03: qty 5

## 2015-04-03 MED ORDER — METHYLPREDNISOLONE 4 MG PO TBPK: ORAL_TABLET | ORAL | Status: DC

## 2015-04-03 MED ORDER — HYDROCOD POLST-CPM POLST ER 10-8 MG/5ML PO SUER: 5.0000 mL | Freq: Two times a day (BID) | ORAL | Status: DC

## 2015-04-03 MED ORDER — PREDNISONE 20 MG PO TABS
30.0000 mg | ORAL_TABLET | Freq: Once | ORAL | Status: AC
  Administered 2015-04-03: 30 mg via ORAL
  Filled 2015-04-03: qty 1

## 2015-04-03 NOTE — ED Notes (Signed)
Patient states that she feels like that she has not been able to catch her breath all day. Patient states that she had similar symptoms about 2 weeks ago and had a sinus infection.

## 2015-04-03 NOTE — ED Provider Notes (Signed)
[facility] Emergency Department Provider Note  ____________________________________________  Time seen: Approximately 4:28 AM  I have reviewed the triage vital signs and the nursing notes.   HISTORY  Chief Complaint Shortness of Breath    HPI Maria Lawson is a 45 y.o. female who presents to the ED from home with a chief complaint of cough, congestion, wheezing and shortness of breath. Patient reports finishing Augmentin 3 days ago for sinus infection. Persists with dry cough associated with coughing spasms and wheezing. States tonight she felt like she could not catch her breath presented to the ED for evaluation. Denies fever, chills, chest pain, abdominal pain, nausea, vomiting, diarrhea. Denies recent travel or trauma. She is also taking over-the-counter Allegra, Flonase and nothing seems to help her symptoms.   Past Medical History  Diagnosis Date  . Allergy 10/12/1998  . Anxiety 12/14/2004  . Migraine with aura   . Panic attack     Patient Active Problem List   Diagnosis Date Noted  . Routine general medical examination at a health care facility 02/02/2015  . Advance care planning 02/02/2015  . Elevated BP 02/02/2015  . Anxiety state 05/06/2006    Past Surgical History  Procedure Laterality Date  . Appendectomy  1987  . Vaginal delivery      Current Outpatient Rx  Name  Route  Sig  Dispense  Refill  . amoxicillin-clavulanate (AUGMENTIN) 875-125 MG tablet   Oral   Take 1 tablet by mouth 2 (two) times daily.   20 tablet   0   . QUEtiapine (SEROQUEL) 25 MG tablet   Oral   Take 25 mg by mouth at bedtime.       3   . sertraline (ZOLOFT) 50 MG tablet   Oral   Take 2 tablets (100 mg total) by mouth daily.           Allergies Latex  Family History  Problem Relation Age of Onset  . Hyperlipidemia Mother   . Hypertension Father   . Hyperlipidemia Father   . Colon cancer Neg Hx   . Breast cancer Neg Hx     Social  History Social History  Substance Use Topics  . Smoking status: Former Smoker -- 0.00 packs/day for 0 years  . Smokeless tobacco: Never Used  . Alcohol Use: 0.0 oz/week    0 Standard drinks or equivalent per week     Comment: rare    Review of Systems Constitutional: No fever/chills Eyes: No visual changes. ENT: Positive for sinus pressure. Positive for nasal congestion. No sore throat. Cardiovascular: Denies chest pain. Respiratory: Positive for dry cough and shortness of breath. Gastrointestinal: No abdominal pain.  No nausea, no vomiting.  No diarrhea.  No constipation. Genitourinary: Negative for dysuria. Musculoskeletal: Negative for back pain. Skin: Negative for rash. Neurological: Negative for headaches, focal weakness or numbness.  10-point ROS otherwise negative.  ____________________________________________   PHYSICAL EXAM:  VITAL SIGNS: ED Triage Vitals  Enc Vitals Group     BP 04/03/15 0209 181/90 mmHg     Pulse Rate 04/03/15 0209 126     Resp 04/03/15 0209 20     Temp 04/03/15 0209 98.1 F (36.7 C)     Temp Source 04/03/15 0209 Oral     SpO2 04/03/15 0209 96 %     Weight 04/03/15 0209 225 lb (102.059 kg)     Height 04/03/15 0209  (1.676 m)     Head Cir --  Peak Flow --      Pain Score --      Pain Loc --      Pain Edu? --      Excl. in GC? --     Constitutional: Alert and oriented. Well appearing and in no acute distress. Eyes: Conjunctivae are normal. PERRL. EOMI. Head: Atraumatic. Nose: Congestion/rhinnorhea. Mouth/Throat: Mucous membranes are moist.  Oropharynx non-erythematous. Neck: No stridor.   Cardiovascular: Normal rate, regular rhythm. Grossly normal heart sounds.  Good peripheral circulation. Respiratory: Normal respiratory effort.  No retractions. Lungs slightly diminished bibasilarly. No wheezing. Gastrointestinal: Soft and nontender. No distention. No abdominal bruits. No CVA tenderness. Musculoskeletal: No lower extremity  tenderness nor edema.  No joint effusions. Neurologic:  Normal speech and language. No gross focal neurologic deficits are appreciated. No gait instability. Skin:  Skin is warm, dry and intact. No rash noted. Psychiatric: Mood and affect are normal. Speech and behavior are normal.  ____________________________________________   LABS (all labs ordered are listed, but only abnormal results are displayed)  Labs Reviewed  BASIC METABOLIC PANEL - Abnormal; Notable for the following:    Glucose, Bld 116 (*)    All other components within normal limits  CBC - Abnormal; Notable for the following:    WBC 11.9 (*)    All other components within normal limits  TROPONIN I   ____________________________________________  EKG  ED ECG REPORT I, SUNG,JADE J, the attending physician, personally viewed and interpreted this ECG.   Date: 04/03/2015  EKG Time: 0214  Rate: 82  Rhythm: normal EKG, normal sinus rhythm  Axis: Normal  Intervals:none  ST&T Change: Nonspecific  ____________________________________________  RADIOLOGY  Chest 2 view (viewed by me, interpreted per Dr. Andria Meuse): No active cardiopulmonary disease. ____________________________________________   PROCEDURES  Procedure(s) performed: None  Critical Care performed: No  ____________________________________________   INITIAL IMPRESSION / ASSESSMENT AND PLAN / ED COURSE  Pertinent labs & imaging results that were available during my care of the patient were reviewed by me and considered in my medical decision making (see chart for details).  45 year old female who recently finished Augmentin for sinusitis who presents with dry cough, chest congestion and shortness of breath. No wheezing on exam. Heart rate 85. Room air saturations 99%. Will initiate treatment with Medrol Dosepak, albuterol inhaler and Tussionex as needed. Strict return precautions given. Patient verbalizes understanding and agrees with plan of  care. ____________________________________________   FINAL CLINICAL IMPRESSION(S) / ED DIAGNOSES  Final diagnoses:  Bronchitis  Wheezing      Irean Hong, MD 04/03/15 [phone]

## 2015-04-03 NOTE — Discharge Instructions (Signed)
1. Take steroid taper as prescribed (Medrol Dosepak). 2. Use albuterol inhaler 2 puffs every 4 hours as needed for wheezing. 3. You may take cough medicine as needed (Tussionex). 4. Return to the ER for worsening symptoms, persistent vomiting, difficulty breathing or other concerns.  Cough, Adult Coughing is a reflex that clears your throat and your airways. Coughing helps to heal and protect your lungs. It is normal to cough occasionally, but a cough that happens with other symptoms or lasts a long time may be a sign of a condition that needs treatment. A cough may last only 2-3 weeks (acute), or it may last longer than 8 weeks (chronic). CAUSES Coughing is commonly caused by:  Breathing in substances that irritate your lungs.  A viral or bacterial respiratory infection.  Allergies.  Asthma.  Postnasal drip.  Smoking.  Acid backing up from the stomach into the esophagus (gastroesophageal reflux).  Certain medicines.  Chronic lung problems, including COPD (or rarely, lung cancer).  Other medical conditions such as heart failure. HOME CARE INSTRUCTIONS  Pay attention to any changes in your symptoms. Take these actions to help with your discomfort:  Take medicines only as told by your health care provider.  If you were prescribed an antibiotic medicine, take it as told by your health care provider. Do not stop taking the antibiotic even if you start to feel better.  Talk with your health care provider before you take a cough suppressant medicine.  Drink enough fluid to keep your urine clear or pale yellow.  If the air is dry, use a cold steam vaporizer or humidifier in your bedroom or your home to help loosen secretions.  Avoid anything that causes you to cough at work or at home.  If your cough is worse at night, try sleeping in a semi-upright position.  Avoid cigarette smoke. If you smoke, quit smoking. If you need help quitting, ask your health care provider.  Avoid  caffeine.  Avoid alcohol.  Rest as needed. SEEK MEDICAL CARE IF:   You have new symptoms.  You cough up pus.  Your cough does not get better after 2-3 weeks, or your cough gets worse.  You cannot control your cough with suppressant medicines and you are losing sleep.  You develop pain that is getting worse or pain that is not controlled with pain medicines.  You have a fever.  You have unexplained weight loss.  You have night sweats. SEEK IMMEDIATE MEDICAL CARE IF:  You cough up blood.  You have difficulty breathing.  Your heartbeat is very fast.   This information is not intended to replace advice given to you by your health care provider. Make sure you discuss any questions you have with your health care provider.   Document Released: 07/29/2010 Document Revised: 10/21/2014 Document Reviewed: 04/08/2014 Elsevier Interactive Patient Education 2016 [employer].  How to Use an Inhaler Proper inhaler technique is very important. Good technique ensures that the medicine reaches the lungs. Poor technique results in depositing the medicine on the tongue and back of the throat rather than in the airways. If you do not use the inhaler with good technique, the medicine will not help you. STEPS TO FOLLOW IF USING AN INHALER WITHOUT AN EXTENSION TUBE  Remove the cap from the inhaler.  If you are using the inhaler for the first time, you will need to prime it. Shake the inhaler for 5 seconds and release four puffs into the air, away from your face. Ask  your health care provider or pharmacist if you have questions about priming your inhaler.  Shake the inhaler for 5 seconds before each breath in (inhalation).  Position the inhaler so that the top of the canister faces up.  Put your index finger on the top of the medicine canister. Your thumb supports the bottom of the inhaler.  Open your mouth.  Either place the inhaler between your teeth and place your lips tightly around  the mouthpiece, or hold the inhaler 1-2 inches away from your open mouth. If you are unsure of which technique to use, ask your health care provider.  Breathe out (exhale) normally and as completely as possible.  Press the canister down with your index finger to release the medicine.  At the same time as the canister is pressed, inhale deeply and slowly until your lungs are completely filled. This should take 4-6 seconds. Keep your tongue down.  Hold the medicine in your lungs for 5-10 seconds (10 seconds is best). This helps the medicine get into the small airways of your lungs.  Breathe out slowly, through pursed lips. Whistling is an example of pursed lips.  Wait at least 15-30 seconds between puffs. Continue with the above steps until you have taken the number of puffs your health care provider has ordered. Do not use the inhaler more than your health care provider tells you.  Replace the cap on the inhaler.  Follow the directions from your health care provider or the inhaler insert for cleaning the inhaler. STEPS TO FOLLOW IF USING AN INHALER WITH AN EXTENSION (SPACER)  Remove the cap from the inhaler.  If you are using the inhaler for the first time, you will need to prime it. Shake the inhaler for 5 seconds and release four puffs into the air, away from your face. Ask your health care provider or pharmacist if you have questions about priming your inhaler.  Shake the inhaler for 5 seconds before each breath in (inhalation).  Place the open end of the spacer onto the mouthpiece of the inhaler.  Position the inhaler so that the top of the canister faces up and the spacer mouthpiece faces you.  Put your index finger on the top of the medicine canister. Your thumb supports the bottom of the inhaler and the spacer.  Breathe out (exhale) normally and as completely as possible.  Immediately after exhaling, place the spacer between your teeth and into your mouth. Close your lips  tightly around the spacer.  Press the canister down with your index finger to release the medicine.  At the same time as the canister is pressed, inhale deeply and slowly until your lungs are completely filled. This should take 4-6 seconds. Keep your tongue down and out of the way.  Hold the medicine in your lungs for 5-10 seconds (10 seconds is best). This helps the medicine get into the small airways of your lungs. Exhale.  Repeat inhaling deeply through the spacer mouthpiece. Again hold that breath for up to 10 seconds (10 seconds is best). Exhale slowly. If it is difficult to take this second deep breath through the spacer, breathe normally several times through the spacer. Remove the spacer from your mouth.  Wait at least 15-30 seconds between puffs. Continue with the above steps until you have taken the number of puffs your health care provider has ordered. Do not use the inhaler more than your health care provider tells you.  Remove the spacer from the inhaler, and place  the cap on the inhaler.  Follow the directions from your health care provider or the inhaler insert for cleaning the inhaler and spacer. If you are using different kinds of inhalers, use your quick relief medicine to open the airways 10-15 minutes before using a steroid if instructed to do so by your health care provider. If you are unsure which inhalers to use and the order of using them, ask your health care provider, nurse, or respiratory therapist. If you are using a steroid inhaler, always rinse your mouth with water after your last puff, then gargle and spit out the water. Do not swallow the water. AVOID:  Inhaling before or after starting the spray of medicine. It takes practice to coordinate your breathing with triggering the spray.  Inhaling through the nose (rather than the mouth) when triggering the spray. HOW TO DETERMINE IF YOUR INHALER IS FULL OR NEARLY EMPTY You cannot know when an inhaler is empty by  shaking it. A few inhalers are now being made with dose counters. Ask your health care provider for a prescription that has a dose counter if you feel you need that extra help. If your inhaler does not have a counter, ask your health care provider to help you determine the date you need to refill your inhaler. Write the refill date on a calendar or your inhaler canister. Refill your inhaler 7-10 days before it runs out. Be sure to keep an adequate supply of medicine. This includes making sure it is not expired, and that you have a spare inhaler.  SEEK MEDICAL CARE IF:   Your symptoms are only partially relieved with your inhaler.  You are having trouble using your inhaler.  You have some increase in phlegm. SEEK IMMEDIATE MEDICAL CARE IF:   You feel little or no relief with your inhalers. You are still wheezing and are feeling shortness of breath or tightness in your chest or both.  You have dizziness, headaches, or a fast heart rate.  You have chills, fever, or night sweats.  You have a noticeable increase in phlegm production, or there is blood in the phlegm. MAKE SURE YOU:   Understand these instructions.  Will watch your condition.  Will get help right away if you are not doing well or get worse.   This information is not intended to replace advice given to you by your health care provider. Make sure you discuss any questions you have with your health care provider.   Document Released: 01/28/2000 Document Revised: 11/20/2012 Document Reviewed: 08/29/2012 Elsevier Interactive Patient Education 2016 [employer] Inc[employer]  Upper Respiratory Infection, Adult Most upper respiratory infections (URIs) are a viral infection of the air passages leading to the lungs. A URI affects the nose, throat, and upper air passages. The most common type of URI is nasopharyngitis and is typically referred to as "the common cold." URIs run their course and usually go away on their own. Most of the time, a URI  does not require medical attention, but sometimes a bacterial infection in the upper airways can follow a viral infection. This is called a secondary infection. Sinus and middle ear infections are common types of secondary upper respiratory infections. Bacterial pneumonia can also complicate a URI. A URI can worsen asthma and chronic obstructive pulmonary disease (COPD). Sometimes, these complications can require emergency medical care and may be life threatening.  CAUSES Almost all URIs are caused by viruses. A virus is a type of germ and can spread from one person  to another.  RISKS FACTORS You may be at risk for a URI if:   You smoke.   You have chronic heart or lung disease.  You have a weakened defense (immune) system.   You are very young or very old.   You have nasal allergies or asthma.  You work in crowded or poorly ventilated areas.  You work in health care facilities or schools. SIGNS AND SYMPTOMS  Symptoms typically develop 2-3 days after you come in contact with a cold virus. Most viral URIs last 7-10 days. However, viral URIs from the influenza virus (flu virus) can last 14-18 days and are typically more severe. Symptoms may include:   Runny or stuffy (congested) nose.   Sneezing.   Cough.   Sore throat.   Headache.   Fatigue.   Fever.   Loss of appetite.   Pain in your forehead, behind your eyes, and over your cheekbones (sinus pain).  Muscle aches.  DIAGNOSIS  Your health care provider may diagnose a URI by:  Physical exam.  Tests to check that your symptoms are not due to another condition such as:  Strep throat.  Sinusitis.  Pneumonia.  Asthma. TREATMENT  A URI goes away on its own with time. It cannot be cured with medicines, but medicines may be prescribed or recommended to relieve symptoms. Medicines may help:  Reduce your fever.  Reduce your cough.  Relieve nasal congestion. HOME CARE INSTRUCTIONS   Take medicines  only as directed by your health care provider.   Gargle warm saltwater or take cough drops to comfort your throat as directed by your health care provider.  Use a warm mist humidifier or inhale steam from a shower to increase air moisture. This may make it easier to breathe.  Drink enough fluid to keep your urine clear or pale yellow.   Eat soups and other clear broths and maintain good nutrition.   Rest as needed.   Return to work when your temperature has returned to normal or as your health care provider advises. You may need to stay home longer to avoid infecting others. You can also use a face mask and careful hand washing to prevent spread of the virus.  Increase the usage of your inhaler if you have asthma.   Do not use any tobacco products, including cigarettes, chewing tobacco, or electronic cigarettes. If you need help quitting, ask your health care provider. PREVENTION  The best way to protect yourself from getting a cold is to practice good hygiene.   Avoid oral or hand contact with people with cold symptoms.   Wash your hands often if contact occurs.  There is no clear evidence that vitamin C, vitamin E, echinacea, or exercise reduces the chance of developing a cold. However, it is always recommended to get plenty of rest, exercise, and practice good nutrition.  SEEK MEDICAL CARE IF:   You are getting worse rather than better.   Your symptoms are not controlled by medicine.   You have chills.  You have worsening shortness of breath.  You have brown or red mucus.  You have yellow or brown nasal discharge.  You have pain in your face, especially when you bend forward.  You have a fever.  You have swollen neck glands.  You have pain while swallowing.  You have white areas in the back of your throat. SEEK IMMEDIATE MEDICAL CARE IF:   You have severe or persistent:  Headache.  Ear pain.  Sinus pain.  Chest pain.  You have chronic lung  disease and any of the following:  Wheezing.  Prolonged cough.  Coughing up blood.  A change in your usual mucus.  You have a stiff neck.  You have changes in your:  Vision.  Hearing.  Thinking.  Mood. MAKE SURE YOU:   Understand these instructions.  Will watch your condition.  Will get help right away if you are not doing well or get worse.   This information is not intended to replace advice given to you by your health care provider. Make sure you discuss any questions you have with your health care provider.   Document Released: 07/26/2000 Document Revised: 06/16/2014 Document Reviewed: 05/07/2013 Elsevier Interactive Patient Education [employer]

## 2015-04-12 ENCOUNTER — Ambulatory Visit: Payer: BLUE CROSS/BLUE SHIELD | Admitting: Family Medicine

## 2015-04-16 ENCOUNTER — Encounter: Payer: Self-pay | Admitting: Family Medicine

## 2015-04-16 ENCOUNTER — Ambulatory Visit (INDEPENDENT_AMBULATORY_CARE_PROVIDER_SITE_OTHER): Payer: BLUE CROSS/BLUE SHIELD | Admitting: Family Medicine

## 2015-04-16 VITALS — BP 132/84 | HR 106 | Temp 98.7°F | Wt 226.5 lb

## 2015-04-16 DIAGNOSIS — R062 Wheezing: Secondary | ICD-10-CM | POA: Diagnosis not present

## 2015-04-16 MED ORDER — PREDNISONE 20 MG PO TABS: ORAL_TABLET | ORAL | Status: DC

## 2015-04-16 MED ORDER — CETIRIZINE HCL 10 MG PO TABS: 10.0000 mg | ORAL_TABLET | Freq: Every day | ORAL | Status: AC

## 2015-04-16 NOTE — Progress Notes (Signed)
Pre visit review using our clinic review tool, if applicable. No additional management support is needed unless otherwise documented below in the visit note.  She has been sick for the last 2 months.  Live christmas trees, dog fur/dander, candles, scents had caused SOB.  Allegra helps some.  This is worse than typical for her.   Seen at ER and eval noted.  SABA helps.  Used daily, most often at night.   No wheeze since on steroid taper.    She typical doesn't have allergy sx, historically.    Meds, vitals, and allergies reviewed.   ROS: See HPI.  Otherwise, noncontributory.  GEN: nad, alert and oriented HEENT: mucous membranes moist, tm w/o erythema, nasal exam w/o erythema NECK: supple w/o LA CV: rrr.   PULM: ctab, no inc wob EXT: no edema SKIN: no acute rash.

## 2015-04-16 NOTE — Patient Instructions (Signed)
Stop flonase for now.  Change to zyrtec, pred with food.   Albuterol as needed.  Update me if not better. We can refer to allergy if needed.  Take care.  Glad to see you.

## 2015-04-18 DIAGNOSIS — R062 Wheezing: Secondary | ICD-10-CM | POA: Insufficient documentation

## 2015-04-18 NOTE — Assessment & Plan Note (Signed)
She has likely had viral illnesses causing some airway inflammation, leading to the SABA responsive wheeze.  This is atypical for her, historically.  We talked about antihistamine change, inhaler change/addition, pred taper, vs allergy referral.   It is reasonable to try another antihistamine, restart pred taper with routine cautions and she'll update me.  If she can get well from the underlying likely viral processes, then she should do well.  We can refer to allergy if needed.  She agrees.

## 2015-12-21 ENCOUNTER — Ambulatory Visit (INDEPENDENT_AMBULATORY_CARE_PROVIDER_SITE_OTHER): Payer: BLUE CROSS/BLUE SHIELD | Admitting: Primary Care

## 2015-12-21 ENCOUNTER — Encounter: Payer: Self-pay | Admitting: Primary Care

## 2015-12-21 VITALS — BP 146/84 | HR 100 | Temp 98.1°F | Ht 66.0 in | Wt 231.8 lb

## 2015-12-21 DIAGNOSIS — J069 Acute upper respiratory infection, unspecified: Secondary | ICD-10-CM | POA: Diagnosis not present

## 2015-12-21 MED ORDER — AMOXICILLIN 500 MG PO CAPS: 500.0000 mg | ORAL_CAPSULE | Freq: Two times a day (BID) | ORAL | 0 refills | Status: DC

## 2015-12-21 NOTE — Progress Notes (Signed)
Subjective:    Patient ID: Maria Lawson, female    DOB: 11-20-1970, 45 y.o.   MRN: [id number]  HPI  Maria Lawson is a 45 year old female who presents today with a chief complaint of cough. She also reports sore throat, chest congestion, nasal congestion, fatigue, chills, body aches. Her symptoms have been present for the past 1 week, mostly with fatigue. Saturday this past weekend her symptoms progressed. Her cough is productive with green sputum. She denies fevers, chest pain, shortness of breath. She's taken tylenol and Mucinex with temporary improvement.   Review of Systems  Constitutional: Positive for chills and fatigue.  HENT: Positive for congestion and sore throat.   Respiratory: Positive for cough.   Gastrointestinal: Positive for nausea.  Musculoskeletal: Positive for myalgias.       Past Medical History:  Diagnosis Date  . Allergy 10/12/1998  . Anxiety 12/14/2004  . Migraine with aura   . Panic attack      Social History   Social History  . Marital status: Married    Spouse name: N/A  . Number of children: 3  . Years of education: N/A   Occupational History  . Former Programme researcher, broadcasting/film/videobridal consultant Unemployed   Social History Main Topics  . Smoking status: Former Smoker    Packs/day: 0.00    Years: 0.00  . Smokeless tobacco: Never Used  . Alcohol use 0.0 oz/week     Comment: rare  . Drug use: No  . Sexual activity: Not on file   Other Topics Concern  . Not on file   Social History Narrative   Married, 1992   3 kids    Past Surgical History:  Procedure Laterality Date  . APPENDECTOMY  1987  . VAGINAL DELIVERY      Family History  Problem Relation Age of Onset  . Hyperlipidemia Mother   . Hypertension Father   . Hyperlipidemia Father   . Colon cancer Neg Hx   . Breast cancer Neg Hx     Allergies  Allergen Reactions  . Latex     REACTION: rash    Current Outpatient Prescriptions on File Prior to Visit  Medication Sig Dispense Refill  .  cetirizine (ZYRTEC) 10 MG tablet Take 1 tablet (10 mg total) by mouth daily.    . QUEtiapine (SEROQUEL) 25 MG tablet Take 25 mg by mouth at bedtime.   3  . sertraline (ZOLOFT) 50 MG tablet Take 2 tablets (100 mg total) by mouth daily. (Patient taking differently: Take 150 mg by mouth daily. )    . albuterol (PROVENTIL HFA;VENTOLIN HFA) 108 (90 Base) MCG/ACT inhaler Inhale 2 puffs into the lungs every 4 (four) hours as needed for wheezing or shortness of breath. (Patient not taking: Reported on 12/21/2015) 1 Inhaler 0  . guaifenesin (HUMIBID E) 400 MG TABS tablet Take 400 mg by mouth every 6 (six) hours.     No current facility-administered medications on file prior to visit.     BP (!) 146/84   Pulse 100   Temp 98.1 F (36.7 C) (Oral)   Ht 5\' 6"  (1.676 m)   Wt 231 lb 12.8 oz (105.1 kg)   LMP 12/15/2015   SpO2 98%   BMI 37.41 kg/m    Objective:   Physical Exam  Constitutional: She appears well-nourished. She appears ill.  HENT:  Right Ear: Tympanic membrane and ear canal normal.  Left Ear: Tympanic membrane and ear canal normal.  Nose: Mucosal edema present. Right  sinus exhibits maxillary sinus tenderness. Right sinus exhibits no frontal sinus tenderness. Left sinus exhibits no maxillary sinus tenderness and no frontal sinus tenderness.  Mouth/Throat: Posterior oropharyngeal erythema present.  Cobblestoning present to posterior pharynx. Dullness to bilateral TM's.  Eyes: Conjunctivae are normal.  Neck: Neck supple.  Cardiovascular: Normal rate and regular rhythm.   Pulmonary/Chest: Effort normal and breath sounds normal. She has no wheezes. She has no rales.  Lymphadenopathy:    She has cervical adenopathy.  Skin: Skin is warm and dry.         Assessment & Plan:  URI:  Cough, congestion, fatigue x 1 week. Started feeling worse over the last several days. Cough productive with green sputum. Vitals today with tachycardia (mild). She does appear ill. Lungs clear and is  stable for outpatient treatment. Given presentation, duration of symptoms, will treat for likely bacterial involvement. Rx for Amoxil course sent to pharmacy. Discussed to continue Mucinex, or try Delsym. Fluids, rest, follow up PRN.  Morrie Sheldonlark,Katherine Kendal, NP

## 2015-12-21 NOTE — Patient Instructions (Signed)
Start amoxicillin antibiotics. Take 1 tablet by mouth twice daily for 7 days.  Cough/Congestion: You may continue Mucinex for congestion. Delsym DM is also a good option for cough and congestion.  Ensure you are staying hydrated with water and get some rest.  It was a pleasure meeting you!

## 2015-12-21 NOTE — Progress Notes (Signed)
Pre visit review using our clinic review tool, if applicable. No additional management support is needed unless otherwise documented below in the visit note. 

## 2015-12-28 DIAGNOSIS — Z6837 Body mass index (BMI) 37.0-37.9, adult: Secondary | ICD-10-CM | POA: Diagnosis not present

## 2015-12-28 DIAGNOSIS — F4323 Adjustment disorder with mixed anxiety and depressed mood: Secondary | ICD-10-CM | POA: Diagnosis not present

## 2016-01-25 DIAGNOSIS — F4323 Adjustment disorder with mixed anxiety and depressed mood: Secondary | ICD-10-CM | POA: Diagnosis not present

## 2016-01-25 DIAGNOSIS — Z6837 Body mass index (BMI) 37.0-37.9, adult: Secondary | ICD-10-CM | POA: Diagnosis not present

## 2016-01-26 ENCOUNTER — Ambulatory Visit (INDEPENDENT_AMBULATORY_CARE_PROVIDER_SITE_OTHER): Payer: BLUE CROSS/BLUE SHIELD | Admitting: Family Medicine

## 2016-01-26 ENCOUNTER — Encounter: Payer: Self-pay | Admitting: Family Medicine

## 2016-01-26 VITALS — BP 134/88 | HR 88 | Temp 98.4°F | Ht 66.0 in | Wt 234.0 lb

## 2016-01-26 DIAGNOSIS — Z23 Encounter for immunization: Secondary | ICD-10-CM | POA: Diagnosis not present

## 2016-01-26 DIAGNOSIS — Z6837 Body mass index (BMI) 37.0-37.9, adult: Secondary | ICD-10-CM | POA: Diagnosis not present

## 2016-01-26 DIAGNOSIS — Z0000 Encounter for general adult medical examination without abnormal findings: Secondary | ICD-10-CM

## 2016-01-26 LAB — CBC WITH DIFFERENTIAL/PLATELET
Basophils Absolute: 0.1 10*3/uL (ref 0.0–0.1)
Basophils Relative: 0.7 % (ref 0.0–3.0)
Eosinophils Absolute: 0.2 10*3/uL (ref 0.0–0.7)
Eosinophils Relative: 2.3 % (ref 0.0–5.0)
HCT: 40.3 % (ref 36.0–46.0)
Hemoglobin: 13.7 g/dL (ref 12.0–15.0)
Lymphocytes Relative: 20.4 % (ref 12.0–46.0)
Lymphs Abs: 1.8 10*3/uL (ref 0.7–4.0)
MCHC: 34 g/dL (ref 30.0–36.0)
MCV: 83.8 fl (ref 78.0–100.0)
Monocytes Absolute: 0.5 10*3/uL (ref 0.1–1.0)
Monocytes Relative: 5.5 % (ref 3.0–12.0)
Neutro Abs: 6.3 10*3/uL (ref 1.4–7.7)
Neutrophils Relative %: 71.1 % (ref 43.0–77.0)
Platelets: 277 10*3/uL (ref 150.0–400.0)
RBC: 4.81 Mil/uL (ref 3.87–5.11)
RDW: 13.4 % (ref 11.5–15.5)
WBC: 8.9 10*3/uL (ref 4.0–10.5)

## 2016-01-26 LAB — HEMOGLOBIN A1C: Hgb A1c MFr Bld: 5.2 % (ref 4.6–6.5)

## 2016-01-26 LAB — COMPREHENSIVE METABOLIC PANEL
ALT: 12 U/L (ref 0–35)
AST: 13 U/L (ref 0–37)
Albumin: 4.6 g/dL (ref 3.5–5.2)
Alkaline Phosphatase: 59 U/L (ref 39–117)
BUN: 10 mg/dL (ref 6–23)
CO2: 28 mEq/L (ref 19–32)
Calcium: 9.4 mg/dL (ref 8.4–10.5)
Chloride: 102 mEq/L (ref 96–112)
Creatinine, Ser: 0.73 mg/dL (ref 0.40–1.20)
GFR: 92.17 mL/min (ref >60.00)
Glucose, Bld: 96 mg/dL (ref 70–99)
Potassium: 4.1 mEq/L (ref 3.5–5.1)
Sodium: 137 mEq/L (ref 135–145)
Total Bilirubin: 0.4 mg/dL (ref 0.2–1.2)
Total Protein: 7.2 g/dL (ref 6.0–8.3)

## 2016-01-26 LAB — TSH: TSH: 1.68 u[IU]/mL (ref 0.35–4.50)

## 2016-01-26 NOTE — Progress Notes (Signed)
Subjective:    Patient ID: Maria Lawson, female    DOB: Oct 18, 1970, 45 y.o.   MRN: [id number]  HPI This is a 45 yo female who presents today for CPE. Has three kids 24, 23, 8. 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 Has been battling depression for may years, has recently gone up on Zoloft dose. Sees therapist regularly. Struggles to exercise regularly. Concerned about weight. Is taking a mindfullness class next month.   Last CPE- 02/01/15 Mammo- at age 45 Pap- not in many years  Colonoscopy- NA Tdap- will have today Flu- never Eye-regular Dental-regular Exercise- no   Past Medical History:  Diagnosis Date  . Allergy 10/12/1998  . Anxiety 12/14/2004  . Migraine with aura   . Panic attack    Past Surgical History:  Procedure Laterality Date  . APPENDECTOMY  1987  . VAGINAL DELIVERY     Family History  Problem Relation Age of Onset  . Hyperlipidemia Mother   . Hypertension Father   . Hyperlipidemia Father   . Colon cancer Neg Hx   . Breast cancer Neg Hx    Social History  Substance Use Topics  . Smoking status: Former Smoker    Packs/day: 0.00    Years: 0.00  . Smokeless tobacco: Never Used  . Alcohol use 0.0 oz/week     Comment: rare      Review of Systems  Constitutional: Positive for fatigue (ongoing). Negative for activity change, appetite change and unexpected weight change.  HENT: Negative for congestion and dental problem.   Eyes: Negative.   Respiratory: Negative for cough, chest tightness and shortness of breath.   Cardiovascular: Negative for chest pain, palpitations and leg swelling.  Gastrointestinal: Negative for abdominal distention, blood in stool, constipation and diarrhea.  Endocrine: Negative.   Genitourinary: Negative for difficulty urinating, dysuria and menstrual problem.  Musculoskeletal: Positive for arthralgias (chronic bilateral knees).  Skin: Negative.   Neurological: Negative.   Hematological: Negative.     Psychiatric/Behavioral: Positive for dysphoric mood.       Objective:   Physical Exam Physical Exam  Constitutional: She is oriented to person, place, and time. She appears well-developed and well-nourished. No distress.  HENT:  Head: Normocephalic and atraumatic.  Right Ear: External ear normal.  Left Ear: External ear normal.  Nose: Nose normal.  Mouth/Throat: Oropharynx is clear and moist. No oropharyngeal exudate.  Eyes: Conjunctivae are normal. Pupils are equal, round, and reactive to light.  Neck: Normal range of motion. Neck supple. No JVD present. No thyromegaly present.  Cardiovascular: Normal rate, regular rhythm, normal heart sounds and intact distal pulses.   Pulmonary/Chest: Effort normal and breath sounds normal.  Abdominal: Soft. Bowel sounds are normal. She exhibits no distension and no mass. There is no tenderness. There is no rebound and no guarding.  Musculoskeletal: Normal range of motion. She exhibits no edema or tenderness.  Lymphadenopathy:    She has no cervical adenopathy.  Neurological: She is alert and oriented to person, place, and time. She has normal reflexes.  Skin: Skin is warm and dry. She is not diaphoretic.  Psychiatric: She has a normal mood and affect. Her behavior is normal. Judgment and thought content normal.  Vitals reviewed.  BP 134/88   Pulse 88   Temp 98.4 F (36.9 C) (Oral)   Ht 5\' 6"  (1.676 m)   Wt 234 lb (106.1 kg)   LMP 01/03/2016   BMI 37.77 kg/m  Wt Readings from Last 3 Encounters:  01/26/16 234 lb (106.1 kg)  12/21/15 231 lb 12.8 oz (105.1 kg)  04/16/15 226 lb 8 oz (102.7 kg)       Assessment & Plan:  1. Annual physical exam - Discussed and encouraged healthy lifestyle choices- adequate sleep, regular exercise, stress management and healthy food choices.  - she will return for pap test - provided number for her to make mammo appointment  2. BMI 37.0-37.9, adult - CBC with Differential/Platelet - Comprehensive  metabolic panel - TSH - Hemoglobin A1c   Olean Reeeborah Kensi Karr, FNP-BC  [facility] [place], [facility]  01/26/2016 9:52 AM

## 2016-01-26 NOTE — Progress Notes (Signed)
Pre visit review using our clinic review tool, if applicable. No additional management support is needed unless otherwise documented below in the visit note. 

## 2016-01-26 NOTE — Patient Instructions (Signed)
Please schedule your mammogram- [phone]- [phone] PLease schedule pap only visit asap  Keeping You Healthy  Get These Tests 1. Blood Pressure- Have your blood pressure checked once a year by your health care provider.  Normal blood pressure is 120/80. 2. Weight- Have your body mass index (BMI) calculated to screen for obesity.  BMI is measure of body fat based on height and weight.  You can also calculate your own BMI at https://www.west-[email or web address]/www.[email or web address]/bmi/. 3. Cholesterol- Have your cholesterol checked every 5 years starting at age 43 then yearly starting at age 43. 4. Chlamydia, HIV, and other sexually transmitted diseases- Get screened every year until age 43, then within three months of each new sexual provider. 5. Pap Test - Every 1-5 years; discuss with your health care provider. 6. Mammogram- Every 1-2 years starting at age 43--50  Take these medicines  Calcium with Vitamin D-Your body needs 1200 mg of Calcium each day and [phone] IU of Vitamin D daily.  Your body can only absorb 500 mg of Calcium at a time so Calcium must be taken in 2 or 3 divided doses throughout the day.  Multivitamin with folic acid- Once daily if it is possible for you to become pregnant.  Get these Immunizations  Gardasil-Series of three doses; prevents HPV related illness such as genital warts and cervical cancer.  Menactra-Single dose; prevents meningitis.  Tetanus shot- Every 10 years.  Flu shot-Every year.  Take these steps 1. Do not smoke-Your healthcare provider can help you quit.  For tips on how to quit go to www.smokefree.gov or call 1-800 QUITNOW. 2. Be physically active- Exercise 5 days a week for at least 30 minutes.  If you are not already physically active, start slow and gradually work up to 30 minutes of moderate physical activity.  Examples of moderate activity include walking briskly, dancing, swimming, bicycling, etc. 3. Breast Cancer- A self breast exam every month is important for early detection  of breast cancer.  For more information and instruction on self breast exams, ask your healthcare provider or [place].eswww.womenshealth.gov/faq/breast-self-exam.cfm. 4. Eat a healthy diet- Eat a variety of healthy foods such as fruits, vegetables, whole grains, low fat milk, low fat cheeses, yogurt, lean meats, poultry and fish, beans, nuts, tofu, etc.  For more information go to www. Thenutritionsource.org 5. Drink alcohol in moderation- Limit alcohol intake to one drink or less per day. Never drink and drive. 6. Depression- Your emotional health is as important as your physical health.  If you're feeling down or losing interest in things you normally enjoy please talk to your healthcare provider about being screened for depression. 7. Dental visit- Brush and floss your teeth twice daily; visit your dentist twice a year. 8. Eye doctor- Get an eye exam at least every 2 years. 9. Helmet use- Always wear a helmet when riding a bicycle, motorcycle, rollerblading or skateboarding. 10. Safe sex- If you may be exposed to sexually transmitted infections, use a condom. 11. Seat belts- Seat belts can save your live; always wear one. 12. Smoke/Carbon Monoxide detectors- These detectors need to be installed on the appropriate level of your home. Replace batteries at least once a year. 13. Skin cancer- When out in the sun please cover up and use sunscreen 15 SPF or higher. 14. Violence- If anyone is threatening or hurting you, please tell your healthcare provider.

## 2016-02-09 ENCOUNTER — Other Ambulatory Visit (HOSPITAL_COMMUNITY)
Admission: RE | Admit: 2016-02-09 | Discharge: 2016-02-09 | Disposition: A | Discharge status: Home / Self Care | Payer: BLUE CROSS/BLUE SHIELD | Source: Ambulatory Visit | Attending: Family Medicine | Admitting: Family Medicine

## 2016-02-09 ENCOUNTER — Encounter: Payer: Self-pay | Admitting: Family Medicine

## 2016-02-09 ENCOUNTER — Ambulatory Visit (INDEPENDENT_AMBULATORY_CARE_PROVIDER_SITE_OTHER): Payer: BLUE CROSS/BLUE SHIELD | Admitting: Family Medicine

## 2016-02-09 VITALS — BP 122/80 | HR 83 | Wt 235.0 lb

## 2016-02-09 DIAGNOSIS — Z113 Encounter for screening for infections with a predominantly sexual mode of transmission: Secondary | ICD-10-CM | POA: Insufficient documentation

## 2016-02-09 DIAGNOSIS — Z124 Encounter for screening for malignant neoplasm of cervix: Secondary | ICD-10-CM

## 2016-02-09 DIAGNOSIS — Z1151 Encounter for screening for human papillomavirus (HPV): Secondary | ICD-10-CM | POA: Diagnosis not present

## 2016-02-09 DIAGNOSIS — Z01419 Encounter for gynecological examination (general) (routine) without abnormal findings: Secondary | ICD-10-CM | POA: Diagnosis not present

## 2016-02-09 DIAGNOSIS — R21 Rash and other nonspecific skin eruption: Secondary | ICD-10-CM

## 2016-02-09 NOTE — Progress Notes (Signed)
   Subjective:    Patient ID: Maria Lawson, female    DOB: Oct 30, 1970, 45 y.o.   MRN: [id number]  HPI This is a pleasant 45 yo female who presents today for pap testing and breast exam. She was in for CPE earlier this month but was unable to do gyn exam on that day. Continues to have regular periods, have always been heavy, lasts about 3 days. Has chronic skin lesions on perineum, worse around her menses, small boils that come and go. No pain with intercourse. Overdue for mammogram, will schedule after first of the year.   Past Medical History:  Diagnosis Date  . Allergy 10/12/1998  . Anxiety 12/14/2004  . Migraine with aura   . Panic attack    Past Surgical History:  Procedure Laterality Date  . APPENDECTOMY  1987  . VAGINAL DELIVERY     Family History  Problem Relation Age of Onset  . Hyperlipidemia Mother   . Hypertension Father   . Hyperlipidemia Father   . Colon cancer Neg Hx   . Breast cancer Neg Hx    Social History  Substance Use Topics  . Smoking status: Former Smoker    Packs/day: 0.00    Years: 0.00  . Smokeless tobacco: Never Used  . Alcohol use 0.0 oz/week     Comment: rare      Review of Systems Per HPI    Objective:   Physical Exam  Constitutional: She is oriented to person, place, and time. She appears well-developed and well-nourished. No distress.  Cardiovascular: Normal rate.   Pulmonary/Chest: Effort normal.  Genitourinary: Vagina normal and uterus normal. No breast swelling, tenderness, discharge or bleeding. Pelvic exam was performed with patient supine. There is rash on the right labia. There is no tenderness or lesion on the right labia. There is rash on the left labia. There is no tenderness or lesion on the left labia. Cervix exhibits no motion tenderness, no discharge and no friability. Right adnexum displays no mass, no tenderness and no fullness. Left adnexum displays no mass, no tenderness and no fullness.  Genitourinary Comments:  Difficult to visualize cervix, tipped up. Perineum and inner thighs with multiple areas hyperpigmented macules. No erythema or drainage.   Neurological: She is alert and oriented to person, place, and time.  Skin: Skin is warm and dry. She is not diaphoretic.  Psychiatric: She has a normal mood and affect. Her behavior is normal. Judgment and thought content normal.  Vitals reviewed.     BP 122/80   Pulse 83   Wt 235 lb (106.6 kg)   LMP 02/02/2016   SpO2 98%   BMI 37.93 kg/m  Wt Readings from Last 3 Encounters:  02/09/16 235 lb (106.6 kg)  01/26/16 234 lb (106.1 kg)  12/21/15 231 lb 12.8 oz (105.1 kg)       Assessment & Plan:  1. Encounter for Papanicolaou smear for cervical cancer screening - Cytology - PAP  2. Rash and nonspecific skin eruption - chronic, patient not interested in treatment or referral, suggested salicylic acid containing soap, follow up if worsening or becomes bothersome   Olean Reeeborah Gessner, FNP-BC  [facility] [place]  02/11/2016 7:45 AM

## 2016-02-09 NOTE — Patient Instructions (Signed)
Don't forget to schedule your mammogram! Take some time to take care of yourself.    Mediterranean Diet A Mediterranean diet refers to food and lifestyle choices that are based on the traditions of countries located on the [place]. This way of eating has been shown to help prevent certain conditions and improve outcomes for people who have chronic diseases, like kidney disease and heart disease. What are tips for following this plan? Lifestyle  Cook and eat meals together with your family, when possible.  Drink enough fluid to keep your urine clear or pale yellow.  Be physically active every day. This includes:  Aerobic exercise like running or swimming.  Leisure activities like gardening, walking, or housework.  Get 7-8 hours of sleep each night.  If recommended by your health care provider, drink red wine in moderation. This means 1 glass a day for nonpregnant women and 2 glasses a day for men. A glass of wine equals 5 oz (150 mL). Reading food labels  Check the serving size of packaged foods. For foods such as rice and pasta, the serving size refers to the amount of cooked product, not dry.  Check the total fat in packaged foods. Avoid foods that have saturated fat or trans fats.  Check the ingredients list for added sugars, such as corn syrup. Shopping  At the grocery store, buy most of your food from the areas near the walls of the store. This includes:  Fresh fruits and vegetables (produce).  Grains, beans, nuts, and seeds. Some of these may be available in unpackaged forms or large amounts (in bulk).  Fresh seafood.  Poultry and eggs.  Low-fat dairy products.  Buy whole ingredients instead of prepackaged foods.  Buy fresh fruits and vegetables in-season from local farmers markets.  Buy frozen fruits and vegetables in resealable bags.  If you do not have access to quality fresh seafood, buy precooked frozen shrimp or canned fish, such as tuna, salmon,  or sardines.  Buy small amounts of raw or cooked vegetables, salads, or olives from the deli or salad bar at your store.  Stock your pantry so you always have certain foods on hand, such as olive oil, canned tuna, canned tomatoes, rice, pasta, and beans. Cooking  Cook foods with extra-virgin olive oil instead of using butter or other vegetable oils.  Have meat as a side dish, and have vegetables or grains as your main dish. This means having meat in small portions or adding small amounts of meat to foods like pasta or stew.  Use beans or vegetables instead of meat in common dishes like chili or lasagna.  Experiment with different cooking methods. Try roasting or broiling vegetables instead of steaming or sauteing them.  Add frozen vegetables to soups, stews, pasta, or rice.  Add nuts or seeds for added healthy fat at each meal. You can add these to yogurt, salads, or vegetable dishes.  Marinate fish or vegetables using olive oil, lemon juice, garlic, and fresh herbs. Meal planning  Plan to eat 1 vegetarian meal one day each week. Try to work up to 2 vegetarian meals, if possible.  Eat seafood 2 or more times a week.  Have healthy snacks readily available, such as:  Vegetable sticks with hummus.  Greek yogurt.  Fruit and nut trail mix.  Eat balanced meals throughout the week. This includes:  Fruit: 2-3 servings a day  Vegetables: 4-5 servings a day  Low-fat dairy: 2 servings a day  Fish, poultry, or lean  meat: 1 serving a day  Beans and legumes: 2 or more servings a week  Nuts and seeds: 1-2 servings a day  Whole grains: 6-8 servings a day  Extra-virgin olive oil: 3-4 servings a day  Limit red meat and sweets to only a few servings a month What are my food choices?  Mediterranean diet  Recommended  Grains: Whole-grain pasta. Brown rice. Bulgar wheat. Polenta. Couscous. Whole-wheat bread. [place] Cobbatmeal. Quinoa.  Vegetables: Artichokes. Beets. Broccoli. Cabbage.  Carrots. Eggplant. Green beans. Chard. Kale. Spinach. Onions. Leeks. Peas. Squash. Tomatoes. Peppers. Radishes.  Fruits: Apples. Apricots. Avocado. Berries. Bananas. Cherries. Dates. Figs. Grapes. Lemons. Melon. Oranges. Peaches. Plums. Pomegranate.  Meats and other protein foods: Beans. Almonds. Sunflower seeds. Pine nuts. Peanuts. Cod. Salmon. Scallops. Shrimp. Tuna. Tilapia. Clams. Oysters. Eggs.  Dairy: Low-fat milk. Cheese. Greek yogurt.  Beverages: Water. Red wine. Herbal tea.  Fats and oils: Extra virgin olive oil. Avocado oil. Grape seed oil.  Sweets and desserts: [place] yogurt with honey. Baked apples. Poached pears. Trail mix.  Seasoning and other foods: Basil. Cilantro. Coriander. Cumin. Mint. Parsley. Sage. Rosemary. Tarragon. Garlic. Oregano. Thyme. Pepper. Balsalmic vinegar. Tahini. Hummus. Tomato sauce. Olives. Mushrooms.  Limit these  Grains: Prepackaged pasta or rice dishes. Prepackaged cereal with added sugar.  Vegetables: Deep fried potatoes (french fries).  Fruits: Fruit canned in syrup.  Meats and other protein foods: Beef. Pork. Lamb. Poultry with skin. Hot dogs. Tomasa BlaseBacon.  Dairy: Ice cream. Sour cream. Whole milk.  Beverages: Juice. Sugar-sweetened soft drinks. Beer. Liquor and spirits.  Fats and oils: Butter. Canola oil. Vegetable oil. Beef fat (tallow). Lard.  Sweets and desserts: Cookies. Cakes. Pies. Candy.  Seasoning and other foods: Mayonnaise. Premade sauces and marinades.  The items listed may not be a complete list. Talk with your dietitian about what dietary choices are right for you. Summary  The Mediterranean diet includes both food and lifestyle choices.  Eat a variety of fresh fruits and vegetables, beans, nuts, seeds, and whole grains.  Limit the amount of red meat and sweets that you eat.  Talk with your health care provider about whether it is safe for you to drink red wine in moderation. This means 1 glass a day for nonpregnant women  and 2 glasses a day for men. A glass of wine equals 5 oz (150 mL). This information is not intended to replace advice given to you by your health care provider. Make sure you discuss any questions you have with your health care provider. Document Released: 09/23/2015 Document Revised: 10/26/2015 Document Reviewed: 09/23/2015 Elsevier Interactive Patient Education  2017 [employer]

## 2016-02-11 LAB — CYTOLOGY - PAP
Chlamydia: NEGATIVE
Diagnosis: NEGATIVE
HPV: NOT DETECTED
Neisseria Gonorrhea: NEGATIVE

## 2016-02-23 ENCOUNTER — Encounter: Payer: Self-pay | Admitting: Family Medicine

## 2016-02-23 ENCOUNTER — Ambulatory Visit: Payer: BLUE CROSS/BLUE SHIELD | Admitting: Family Medicine

## 2016-02-23 ENCOUNTER — Ambulatory Visit (INDEPENDENT_AMBULATORY_CARE_PROVIDER_SITE_OTHER): Payer: BLUE CROSS/BLUE SHIELD | Admitting: Family Medicine

## 2016-02-23 VITALS — BP 124/74 | HR 84 | Temp 98.9°F | Wt 230.0 lb

## 2016-02-23 DIAGNOSIS — R1011 Right upper quadrant pain: Secondary | ICD-10-CM | POA: Diagnosis not present

## 2016-02-23 NOTE — Progress Notes (Signed)
Pre visit review using our clinic review tool, if applicable. No additional management support is needed unless otherwise documented below in the visit note. 

## 2016-02-23 NOTE — Progress Notes (Signed)
H/o loose stools starting a few years ago.  Had u/s at that point, unremarkable.   She started 21 day fast, vegan and no extra sugar diet.  10 days into diet.  In the meantime she has had some yellow stools.  Some mild RUQ and R shoulder twinges.  "I couldn't be eating any healthier".  Bloating is better but still with abnormal RUQ sensations.    Prev with occ yellowish loose stools.  Now with yellow solid stools with high fiber intake.  LFTs wnl in 01/2016.  No fevers.  No vomiting.  No blood in stool.  No jaundice.    Mother with history of gall bladder sludge per patient report.   PMH and SH reviewed  ROS: Per HPI unless specifically indicated in ROS section, no CP, SOB, rash, BLE edema  Meds, vitals, and allergies reviewed.   GEN: nad, alert and oriented HEENT: mucous membranes moist NECK: supple w/o LA CV: rrr.  no murmur PULM: ctab, no inc wob ABD: soft, +bs and not ttp except minimally ttp in the RUQ w/o rebound.  EXT: no edema SKIN: no acute rash

## 2016-02-23 NOTE — Patient Instructions (Signed)
Shirlee LimerickMarion will call about your referral. Gradually work back to a less restricted diet, but still avoid fats.   Take care.  Update me as needed.

## 2016-02-24 DIAGNOSIS — R1011 Right upper quadrant pain: Secondary | ICD-10-CM | POA: Insufficient documentation

## 2016-02-24 NOTE — Assessment & Plan Note (Signed)
Worse recently, now with yellow stools with diet change.  D/w pt.  FH noted re: GB hx with her mother.  Nontoxic.  Okay for outpatient f/u.  Prev LFTs recently wnl, d/w pt.  Needs w/u.  Check u/s and if wnl then either needs HIDA and/or GI referral.  D/w pt.  She agrees.  D/w pt about normalizing her diet, see AVS.  She agrees.  All questions answered.  U/s ordered.

## 2016-02-25 ENCOUNTER — Encounter: Payer: Self-pay | Admitting: Family Medicine

## 2016-02-25 ENCOUNTER — Other Ambulatory Visit: Payer: Self-pay | Admitting: Family Medicine

## 2016-02-25 MED ORDER — OSELTAMIVIR PHOSPHATE 75 MG PO CAPS: 75.0000 mg | ORAL_CAPSULE | Freq: Two times a day (BID) | ORAL | 0 refills | Status: DC

## 2016-02-29 ENCOUNTER — Ambulatory Visit: Payer: BLUE CROSS/BLUE SHIELD

## 2016-03-07 ENCOUNTER — Ambulatory Visit
Admission: RE | Admit: 2016-03-07 | Discharge: 2016-03-07 | Disposition: A | Discharge status: Home / Self Care | Payer: BLUE CROSS/BLUE SHIELD | Source: Ambulatory Visit | Attending: Family Medicine | Admitting: Family Medicine

## 2016-03-07 DIAGNOSIS — R1011 Right upper quadrant pain: Secondary | ICD-10-CM | POA: Diagnosis not present

## 2016-03-07 DIAGNOSIS — K8020 Calculus of gallbladder without cholecystitis without obstruction: Secondary | ICD-10-CM | POA: Insufficient documentation

## 2016-03-10 ENCOUNTER — Other Ambulatory Visit: Payer: Self-pay | Admitting: Family Medicine

## 2016-03-10 DIAGNOSIS — K8020 Calculus of gallbladder without cholecystitis without obstruction: Secondary | ICD-10-CM | POA: Insufficient documentation

## 2016-03-13 ENCOUNTER — Encounter: Payer: Self-pay | Admitting: *Deleted

## 2016-03-23 ENCOUNTER — Ambulatory Visit (INDEPENDENT_AMBULATORY_CARE_PROVIDER_SITE_OTHER): Payer: BLUE CROSS/BLUE SHIELD | Admitting: General Surgery

## 2016-03-23 ENCOUNTER — Encounter: Payer: Self-pay | Admitting: General Surgery

## 2016-03-23 VITALS — BP 140/70 | HR 104 | Resp 12 | Ht 66.0 in | Wt 229.0 lb

## 2016-03-23 DIAGNOSIS — K219 Gastro-esophageal reflux disease without esophagitis: Secondary | ICD-10-CM | POA: Diagnosis not present

## 2016-03-23 DIAGNOSIS — K8020 Calculus of gallbladder without cholecystitis without obstruction: Secondary | ICD-10-CM | POA: Diagnosis not present

## 2016-03-23 MED ORDER — PANTOPRAZOLE SODIUM 20 MG PO TBEC: 20.0000 mg | DELAYED_RELEASE_TABLET | Freq: Every day | ORAL | 0 refills | Status: DC

## 2016-03-23 NOTE — Patient Instructions (Addendum)
Patient is to take protonix once a day, send a message by the 04/03/16.

## 2016-03-23 NOTE — Progress Notes (Signed)
Patient ID: Maria Lawson, female   DOB: [birth year], 46 y.o.   MRN: [id number]  Chief Complaint  Patient presents with  . Abdominal Pain    HPI Maria Lawson is a 46 y.o. female is today for evaluation of gallstones. Patient states she has pain in right shoulder, and right upper quadrant. She went to primary care provider because of yellow diarrhea around 02/14/16 during a fasting/change in diet. The patient was on a diet during this time which only involved eating foods that originated from [employer]. 8 days into this diet she had worsening diarrhea, more than baseline. She would have episodes of pain involving the top of the right shoulder, right infrascapular area and it will to a lesser her extent move in to the right upper quadrant.   Pudding like substance stools, has been happening for years, greasy food makes it worse. The stools recently may be slightly more foul smelling, but she has not been aware that the stools "float". . Patient lost 6 pounds in 10 days.   She moves her bowels everytime she eats, usually within 20 minutes of a meal.    Mother of 3 boys, 70, 22 and 8. She reports one year after her youngest child was born most of her postprandial symptoms developed although they are more pronounced over the last 2-3 years.  Husband, Maria Lawson, present at visit.  HPI   Past Medical History:  Diagnosis Date  . Allergy 10/12/1998  . Anxiety 12/14/2004  . Migraine with aura   . Panic attack     Past Surgical History:  Procedure Laterality Date  . APPENDECTOMY  1987  . VAGINAL DELIVERY      Family History  Problem Relation Age of Onset  . Hyperlipidemia Mother   . Hypertension Father   . Hyperlipidemia Father   . Colon cancer Neg Hx   . Breast cancer Neg Hx     Social History Social History  Substance Use Topics  . Smoking status: Current Every Day Smoker    Packs/day: 0.00    Years: 0.00    Types: E-cigarettes  . Smokeless tobacco: Never Used     Comment: age  32 quit  . Alcohol use 0.0 oz/week     Comment: rare    Allergies  Allergen Reactions  . Latex     REACTION: rash    Current Outpatient Prescriptions  Medication Sig Dispense Refill  . cetirizine (ZYRTEC) 10 MG tablet Take 1 tablet (10 mg total) by mouth daily.    . clonazePAM (KLONOPIN) 0.5 MG tablet Take 0.25 mg by mouth as needed for anxiety.    Marland Kitchen guaifenesin (HUMIBID E) 400 MG TABS tablet Take 400 mg by mouth every 6 (six) hours.    Marland Kitchen QUEtiapine (SEROQUEL) 25 MG tablet Take 25 mg by mouth at bedtime.   3  . sertraline (ZOLOFT) 100 MG tablet Take 200 mg by mouth daily.    Marland Kitchen albuterol (PROVENTIL HFA;VENTOLIN HFA) 108 (90 Base) MCG/ACT inhaler Inhale 2 puffs into the lungs every 4 (four) hours as needed for wheezing or shortness of breath. (Patient not taking: Reported on 02/23/2016) 1 Inhaler 0  . pantoprazole (PROTONIX) 20 MG tablet Take 1 tablet (20 mg total) by mouth daily. 30 tablet 0   No current facility-administered medications for this visit.     Review of Systems Review of Systems  Constitutional: Negative.   Respiratory: Negative.   Cardiovascular: Negative.   Gastrointestinal: Positive for abdominal pain.  Blood pressure 140/70, pulse (!) 104, resp. rate 12, height 5\' 6"  (1.676 m), weight 229 lb (103.9 kg), last menstrual period 03/01/2016.  Physical Exam Physical Exam  Constitutional: She is oriented to person, place, and time. She appears well-developed and well-nourished.  Eyes: Conjunctivae are normal. No scleral icterus.  Neck: Neck supple.  Cardiovascular: Normal rate, regular rhythm and normal heart sounds.   Pulmonary/Chest: Effort normal and breath sounds normal.  Abdominal: Soft. Normal appearance and bowel sounds are normal. There is no hepatosplenomegaly. There is no tenderness. No hernia.  Lymphadenopathy:    She has no cervical adenopathy.  Neurological: She is alert and oriented to person, place, and time.  Skin: Skin is warm and dry.     Data Reviewed Abdominal ultrasound dated 12/19/2013 showed no evidence of cholelithiasis or cholecystitis. Study completed for diffuse abdominal pain. (Clinical history long-standing loose stools, fecal urgency after meals, baseline. Occasional back abdominal pain involving the right shoulder blade and occasionally the right hip. No clear dietary trigger. Question IBS with or without biliary tract disease)   Abdominal ultrasound dated 03/07/2016 reported a 6 mm echogenic, multiple shadowing stones without gallbladder wall thickening or evidence of cholecystitis.  Laboratory studies dated 01/26/2016 showed a normal CBC with a white blood cell count of 8900, 71% polys, hemoglobin 13.7. Comprehensive metabolic panel of the same date was unremarkable.  Assessment    Pain pattern consistent with biliary colic, although triggered by almost any and all meals.  Cholelithiasis without evidence of cholecystitis.  Frequent stools, predating ultrasound evidence of gallstones.  Increased belching and reflux symptoms over the last several months, in part predating the episode of pain and worsening diarrhea noted in early January.    Plan    Based on her young age and the recent identification of gallstones, cholecystectomy could be considered to see if it would resolve all the pain and loose stools. Other options would include a trial of a PPI to see if this would resolve the belching, reflux, and potentially affect the right upper quadrant pain she is been noting the last few weeks.  Possible need for endoscopy/colonoscopy to assess for colitis (unlikely with the long duration of symptoms predating the flare of January 2018) or celiac disease (unlikely based on her long-standing weight issues.     Patient is to take protonix 40 mg once a day and send a My Chart message by the 04/03/16.   If the patient doesn't show significant improvement would recommend a UGI/SBFT to look for radiologic evidence  of a malabsorptive mucosal pattern.  Greater than 45 minutes were spent reviewing the patient's history, previous imaging studies and outside records.  This information has been scribed by Ples SpecterJessica Qualls CMA.   Earline MayotteByrnett, Kelvyn Schunk W 03/24/2016, 3:00 PM

## 2016-03-24 DIAGNOSIS — K219 Gastro-esophageal reflux disease without esophagitis: Secondary | ICD-10-CM | POA: Insufficient documentation

## 2016-04-03 ENCOUNTER — Encounter: Payer: Self-pay | Admitting: General Surgery

## 2016-05-07 ENCOUNTER — Encounter: Payer: Self-pay | Admitting: General Surgery

## 2016-05-08 ENCOUNTER — Other Ambulatory Visit: Payer: Self-pay

## 2016-05-08 MED ORDER — PANTOPRAZOLE SODIUM 40 MG PO TBEC: 40.0000 mg | DELAYED_RELEASE_TABLET | Freq: Every day | ORAL | 11 refills | Status: DC

## 2016-05-08 NOTE — Telephone Encounter (Signed)
PATIENT CALLED BACK. PLEASE RETURN HER CALL AT [phone].

## 2016-05-08 NOTE — Telephone Encounter (Signed)
Notified patient of prescription. Patient scheduled to follow up here on 05/22/16.

## 2016-05-22 ENCOUNTER — Ambulatory Visit (INDEPENDENT_AMBULATORY_CARE_PROVIDER_SITE_OTHER): Payer: BLUE CROSS/BLUE SHIELD | Admitting: General Surgery

## 2016-05-22 ENCOUNTER — Encounter: Payer: Self-pay | Admitting: General Surgery

## 2016-05-22 VITALS — BP 140/78 | HR 90 | Resp 14 | Ht 66.0 in | Wt 232.0 lb

## 2016-05-22 DIAGNOSIS — K219 Gastro-esophageal reflux disease without esophagitis: Secondary | ICD-10-CM | POA: Diagnosis not present

## 2016-05-22 MED ORDER — PANTOPRAZOLE SODIUM 20 MG PO TBEC: 20.0000 mg | DELAYED_RELEASE_TABLET | Freq: Every day | ORAL | 3 refills | Status: DC

## 2016-05-22 NOTE — Progress Notes (Signed)
Patient ID: Maria Lawson, female   DOB: 1971/01/10, 46 y.o.   MRN: [id number]  Chief Complaint  Patient presents with  . Other    stomach pain    HPI Maria Lawson is a 46 y.o. female here to discuss stomach pain and changes in medication. She had used the Protonix with great results. She then tried the Prilosec and states that her stomach pain returned after about 4 days. She says if felt like she drank a giant cup of acid. She ended up switching back to the Protonix with good results. She does have some pain in her right shoulder and neck if she eats a large amount of greasy foods still. She reports no problems with using the bathroom or any urgency issues. The patient did report that her stools became more firm when she began her PPI therapy, somewhat atypical with some people will develop loose stools.  HPI  Past Medical History:  Diagnosis Date  . Allergy 10/12/1998  . Anxiety 12/14/2004  . Migraine with aura   . Panic attack     Past Surgical History:  Procedure Laterality Date  . APPENDECTOMY  1987  . VAGINAL DELIVERY      Family History  Problem Relation Age of Onset  . Hyperlipidemia Mother   . Hypertension Father   . Hyperlipidemia Father   . Colon cancer Neg Hx   . Breast cancer Neg Hx     Social History Social History  Substance Use Topics  . Smoking status: Current Every Day Smoker    Packs/day: 0.00    Years: 0.00    Types: E-cigarettes  . Smokeless tobacco: Never Used     Comment: age 77 quit  . Alcohol use 0.0 oz/week     Comment: rare    Allergies  Allergen Reactions  . Latex     REACTION: rash    Current Outpatient Prescriptions  Medication Sig Dispense Refill  . cetirizine (ZYRTEC) 10 MG tablet Take 1 tablet (10 mg total) by mouth daily.    . clonazePAM (KLONOPIN) 0.5 MG tablet Take 0.25 mg by mouth as needed for anxiety.    Marland Kitchen guaifenesin (HUMIBID E) 400 MG TABS tablet Take 400 mg by mouth every 6 (six) hours.    Marland Kitchen QUEtiapine  (SEROQUEL) 25 MG tablet Take 25 mg by mouth at bedtime.   3  . sertraline (ZOLOFT) 100 MG tablet Take 200 mg by mouth daily.    . pantoprazole (PROTONIX) 20 MG tablet Take 1 tablet (20 mg total) by mouth daily. 90 tablet 3   No current facility-administered medications for this visit.     Review of Systems Review of Systems  Constitutional: Negative.   Respiratory: Negative.   Cardiovascular: Negative.   Gastrointestinal: Negative.     Blood pressure 140/78, pulse 90, resp. rate 14, height  (1.676 m), weight 232 lb (105.2 kg).  Physical Exam Physical Exam Not performed.   Assessment    Gastroesophageal reflux disease, improved on PPI therapy     Plan    The patient did not tolerated change from proton asked to omeprazole. She is concerned about the long-term use of these medications having reviewed the package insert. We discussed the possibility of changing to an H2 blocker which does not have the warnings regarding the development of neuroendocrine tumors. At this time, she is felt so much better on the use of Protonix 20 mg per day, better than 40 mg per day, we'll reinstitute the  former. If she continues to improve and remains asymptomatic in regards to her gallstones would plan for follow-up here on an as-needed basis. Ongoing PPI/H2 blocker therapy can be administered by her PCP.     HPI, Physical Exam, Assessment and Plan have been scribed under the direction and in the presence of Earline Mayotte, MD  Carron Brazen, LPN  I have completed the exam and reviewed the above documentation for accuracy and completeness.  I agree with the above.  Museum/gallery conservator has been used and any errors in dictation or transcription are unintentional.  Donnalee Curry, M.D., F.A.C.S.  Earline Mayotte 05/23/2016, 7:12 AM

## 2016-05-22 NOTE — Patient Instructions (Addendum)
May try using less Protonix after you are stable. You may also try using an alternative acid reducer.

## 2016-06-01 ENCOUNTER — Ambulatory Visit (INDEPENDENT_AMBULATORY_CARE_PROVIDER_SITE_OTHER): Payer: BLUE CROSS/BLUE SHIELD | Admitting: Family Medicine

## 2016-06-01 ENCOUNTER — Encounter: Payer: Self-pay | Admitting: Family Medicine

## 2016-06-01 DIAGNOSIS — R05 Cough: Secondary | ICD-10-CM | POA: Diagnosis not present

## 2016-06-01 DIAGNOSIS — R059 Cough, unspecified: Secondary | ICD-10-CM

## 2016-06-01 DIAGNOSIS — K219 Gastro-esophageal reflux disease without esophagitis: Secondary | ICD-10-CM

## 2016-06-01 NOTE — Patient Instructions (Addendum)
Get back to  of protonix a day.  After about 1 month, then try to skip 1 or 2 days a week.  Then gradually cut back by a dose per week.  Try flonase.  If you can't tolerate the scent, then get OTC nasonex.    Keep using the albuterol as needed.   Update me as needed.

## 2016-06-01 NOTE — Progress Notes (Signed)
Pre visit review using our clinic review tool, if applicable. No additional management support is needed unless otherwise documented below in the visit note. 

## 2016-06-01 NOTE — Progress Notes (Signed)
She got a puppy and tickets to a [employer] for her birthday.   She is excited about both  She saw Byrnett and started on protonix and she clearly improved.  Failed treatment with prilosec but did better back on protonix.  She is on protonix  a day now, getting ready to start  a day.  We talked about tapering her medicine.  See AVS.  Abd pain is much better in the meantime.  She cut out pork and red meat in the meantime and that seemed to help.    Cough.  Started about 2-3 weeks ago.  She thought it was allergies initially.  Had HA.   Still using guaifenesin.   Had to start back on SABA, had quick/good relief.  She usually doesn't have to use SABA at all.  No fevers.   Stuffy.  Ears feel full/pressure/tight.    She had used flonase in the past, not recently.  Has been on zyrtec for extended period.    No other inhaler use, no inhaled steroid use.    Meds, vitals, and allergies reviewed.   ROS: Per HPI unless specifically indicated in ROS section   GEN: nad, alert and oriented HEENT: mucous membranes moist, tm w/o erythema, nasal exam w/o erythema, clear discharge noted,  OP with cobblestoning, sinuses not ttp NECK: supple w/o LA CV: rrr.   PULM: ctab, no inc wob EXT: no edema

## 2016-06-02 DIAGNOSIS — R059 Cough, unspecified: Secondary | ICD-10-CM | POA: Insufficient documentation

## 2016-06-02 DIAGNOSIS — R05 Cough: Secondary | ICD-10-CM | POA: Insufficient documentation

## 2016-06-02 NOTE — Assessment & Plan Note (Signed)
See after visit summary about slow taper of PPI. Discussed with patient. She agrees. She is better in the meantime.

## 2016-06-02 NOTE — Assessment & Plan Note (Addendum)
Likely allergy induced. Clearly better with albuterol use. Restart Flonase. Use albuterol as needed. Update me if not improved. She agrees. If she cannot tolerate Flonase she can use over-the-counter Nasonex. She can substitute Allegra for Zyrtec if desired. Nontoxic. Okay for outpatient follow-up.

## 2016-12-08 ENCOUNTER — Ambulatory Visit (INDEPENDENT_AMBULATORY_CARE_PROVIDER_SITE_OTHER): Payer: BLUE CROSS/BLUE SHIELD | Admitting: Family Medicine

## 2016-12-08 ENCOUNTER — Encounter: Payer: Self-pay | Admitting: Family Medicine

## 2016-12-08 DIAGNOSIS — J0110 Acute frontal sinusitis, unspecified: Secondary | ICD-10-CM | POA: Diagnosis not present

## 2016-12-08 MED ORDER — AMOXICILLIN-POT CLAVULANATE 875-125 MG PO TABS: 1.0000 | ORAL_TABLET | Freq: Two times a day (BID) | ORAL | 0 refills | Status: DC

## 2016-12-08 NOTE — Progress Notes (Signed)
Sx started about 1 week ago. Frontal pain.  Upper tooth pain.  Eyelid irritation.  On flonase and mucinex and zyrtec, using a humidifier.  No fevers known.  No vomiting no diarrhea but is nauseated.  No ST, more throat clearing than cough.  Some ear B ear pressure.    Meds, vitals, and allergies reviewed.   ROS: Per HPI unless specifically indicated in ROS section   GEN: nad, alert and oriented HEENT: mucous membranes moist, tm w/o erythema, nasal exam w/o erythema, clear discharge noted,  OP with cobblestoning, frontal sinuses ttp B NECK: supple w/o LA CV: rrr.   PULM: ctab, no inc wob EXT: no edema SKIN: no acute rash

## 2016-12-08 NOTE — Patient Instructions (Signed)
Okay to use flonase twice a day for about 3 days.  Continue nasal saline.  Start augmentin.  Take care.  Glad to see you.  Update us as needed.

## 2016-12-10 NOTE — Assessment & Plan Note (Signed)
Nontoxic. Okay for outpatient follow-up.Okay to use flonase twice a day for about 3 days, then cut back to routine dosing.   Continue nasal saline.  Start augmentin.  Update us as needed.

## 2017-01-09 ENCOUNTER — Ambulatory Visit: Payer: BLUE CROSS/BLUE SHIELD | Admitting: Internal Medicine

## 2017-01-09 ENCOUNTER — Encounter: Payer: Self-pay | Admitting: Internal Medicine

## 2017-01-09 VITALS — BP 134/86 | HR 95 | Temp 98.5°F | Wt 242.0 lb

## 2017-01-09 DIAGNOSIS — B084 Enteroviral vesicular stomatitis with exanthem: Secondary | ICD-10-CM | POA: Diagnosis not present

## 2017-01-09 MED ORDER — MAGIC MOUTHWASH W/LIDOCAINE
5.0000 mL | Freq: Three times a day (TID) | ORAL | 0 refills | Status: DC | PRN
Start: 2017-01-09 — End: 2017-03-12

## 2017-01-10 ENCOUNTER — Encounter: Payer: Self-pay | Admitting: Internal Medicine

## 2017-01-10 NOTE — Progress Notes (Signed)
Subjective:    Patient ID: Maria Lawson, female    DOB: 1970-11-29, 46 y.o.   MRN: [id number]  HPI  Pt presents to the clinic today with c/o fatigue, blisters in her mouth and sore throat. She reports this started 3-4 days ago. She is having a lot of pain with swallowing. She denies runny nose, nasal congestion or cough. She denies fever, chills or body aches. She has tired Tylenol, Flonase and Zyrtec with minimal relief. She has not sick contacts, works in a preschool.  Review of Systems      Past Medical History:  Diagnosis Date  . Allergy 10/12/1998  . Anxiety 12/14/2004  . Migraine with aura   . Panic attack     Current Outpatient Medications  Medication Sig Dispense Refill  . albuterol (PROVENTIL HFA;VENTOLIN HFA) 108 (90 Base) MCG/ACT inhaler Inhale 2 puffs into the lungs every 4 (four) hours as needed for wheezing or shortness of breath.    Marland Kitchen. amoxicillin-clavulanate (AUGMENTIN) 875-125 MG tablet Take 1 tablet by mouth 2 (two) times daily. 20 tablet 0  . cetirizine (ZYRTEC) 10 MG tablet Take 1 tablet (10 mg total) by mouth daily.    . clonazePAM (KLONOPIN) 0.5 MG tablet Take 0.25 mg by mouth as needed for anxiety.    . fluticasone (FLONASE) 50 MCG/ACT nasal spray Place 2 sprays into both nostrils daily.    Marland Kitchen. guaifenesin (HUMIBID E) 400 MG TABS tablet Take 400 mg by mouth every 6 (six) hours.    . pantoprazole (PROTONIX) 20 MG tablet Take 1 tablet (20 mg total) by mouth daily. 90 tablet 3  . QUEtiapine (SEROQUEL) 25 MG tablet Take 25 mg by mouth at bedtime.   3  . sertraline (ZOLOFT) 100 MG tablet Take 200 mg by mouth daily.    . magic mouthwash w/lidocaine SOLN Take 5 mLs by mouth 3 (three) times daily as needed for mouth pain. 120 mL 0   No current facility-administered medications for this visit.     Allergies  Allergen Reactions  . Latex     REACTION: rash    Family History  Problem Relation Age of Onset  . Hyperlipidemia Mother   . Hypertension Father   .  Hyperlipidemia Father   . Colon cancer Neg Hx   . Breast cancer Neg Hx     Social History   Socioeconomic History  . Marital status: Married    Spouse name: Not on file  . Number of children: 3  . Years of education: Not on file  . Highest education level: Not on file  Social Needs  . Financial resource strain: Not on file  . Food insecurity - worry: Not on file  . Food insecurity - inability: Not on file  . Transportation needs - medical: Not on file  . Transportation needs - non-medical: Not on file  Occupational History  . Occupation: Former Emergency planning/management officerbridal consultant    Employer: UNEMPLOYED  Tobacco Use  . Smoking status: Current Every Day Smoker    Packs/day: 0.00    Years: 0.00    Pack years: 0.00    Types: E-cigarettes  . Smokeless tobacco: Never Used  . Tobacco comment: age 46 quit  Substance and Sexual Activity  . Alcohol use: Yes    Alcohol/week: 0.0 oz    Comment: rare  . Drug use: No  . Sexual activity: Not on file  Other Topics Concern  . Not on file  Social History Narrative   Married, 1992  3 kids     Constitutional: Pt reports fatigue. Denies fever, malaise, headache or abrupt weight changes.  HEENT: Pt reports sore throat. Denies eye pain, eye redness, ear pain, ringing in the ears, wax buildup, runny nose, nasal congestion, bloody nose Respiratory: Denies difficulty breathing, shortness of breath, cough or sputum production.     No other specific complaints in a complete review of systems (except as listed in HPI above).  Objective:   Physical Exam   BP 134/86   Pulse 95   Temp 98.5 F (36.9 C) (Oral)   Wt 242 lb (109.8 kg)   LMP 12/10/2016   SpO2 98%   BMI 39.06 kg/m  Wt Readings from Last 3 Encounters:  01/09/17 242 lb (109.8 kg)  12/08/16 241 lb 4 oz (109.4 kg)  06/01/16 232 lb (105.2 kg)    General: Appears her stated age, in NAD. Skin: Warm, dry and intact. No rashes noted. HEENT: Throat/Mouth: Teeth present, mucosa erytheatous  and moist, no exudate noted. Multiple ulcerations and blisters noted in mouth.  Neck:  No adenopathy noted. Pulmonary/Chest: Normal effort and positive vesicular breath sounds. No respiratory distress. No wheezes, rales or ronchi noted.    BMET    Component Value Date/Time   NA 137 01/26/2016 0958   K 4.1 01/26/2016 0958   CL 102 01/26/2016 0958   CO2 28 01/26/2016 0958   GLUCOSE 96 01/26/2016 0958   BUN 10 01/26/2016 0958   CREATININE 0.73 01/26/2016 0958   CALCIUM 9.4 01/26/2016 0958   GFRNONAA >60 04/03/2015 0214   GFRAA >60 04/03/2015 0214    Lipid Panel     Component Value Date/Time   CHOL 202 (H) 02/01/2015 1446   TRIG 165.0 (H) 02/01/2015 1446   HDL 50.30 02/01/2015 1446   CHOLHDL 4 02/01/2015 1446   VLDL 33.0 02/01/2015 1446   LDLCALC 118 (H) 02/01/2015 1446    CBC    Component Value Date/Time   WBC 8.9 01/26/2016 0958   RBC 4.81 01/26/2016 0958   HGB 13.7 01/26/2016 0958   HCT 40.3 01/26/2016 0958   PLT 277.0 01/26/2016 0958   MCV 83.8 01/26/2016 0958   MCH 28.6 04/03/2015 0214   MCHC 34.0 01/26/2016 0958   RDW 13.4 01/26/2016 0958   LYMPHSABS 1.8 01/26/2016 0958   MONOABS 0.5 01/26/2016 0958   EOSABS 0.2 01/26/2016 0958   BASOSABS 0.1 01/26/2016 0958    Hgb A1C Lab Results  Component Value Date   HGBA1C 5.2 01/26/2016           Assessment & Plan:   Hand, Foot and Mouth Disease:  RST: negative Upon further questioning, her hands itch but she does not have any lesions on her hands or feet eRx for Magic Mouthwash- use as directed Avoid contact with children until symptoms resolve  Return precautions discussed Nicki ReaperBAITY, Conor Filsaime, NP

## 2017-01-10 NOTE — Patient Instructions (Signed)
Hand, Foot, and Mouth Disease, Adult Hand, foot, and mouth disease is a common viral illness. It happens mainly in children who are younger than 44 years old, but adolescents and adults may also get it. The illness often causes:  Sore throat.  Sores in the mouth.  Fever.  Rash on the hands and feet.  Usually, this condition is not serious. Most people get better within 1-2 weeks. What are the causes? This condition is usually caused by a group of viruses called enteroviruses. The disease can spread from person to person (is contagious). A person is most contagious during the first week of the illness. The infection spreads through direct contact with:  Nose discharge of an infected person.  Throat discharge of an infected person.  Stool (feces) of an infected person.  What are the signs or symptoms? Symptoms of this condition include:  Small sores in the mouth. These may cause pain.  A rash on the hands and feet, and sometimes on the buttocks. The rash may also occur on the arms, legs, or other areas of the body. The rash may look like small red bumps or sores and may have blisters.  Fever.  Body aches or headaches.  Irritability.  Decreased appetite.  How is this diagnosed? This condition can usually be diagnosed with a physical exam in which your health care provider will look at your rash and mouth sores. Tests are usually not needed. In some cases, a stool (feces) sample or a throat swab may be taken to check for the virus or for other infections. How is this treated? In most cases, no treatment is needed. People usually get better within 2 weeks without treatment. To help relieve pain or fever, your health care provider may recommend over-the-counter medicines such as ibuprofen or acetaminophen. To help relieve discomfort from mouth sores, your health care provider may recommend using:  Solutions that are rinsed in the mouth.  Pain-relieving gel that is applied to the  sores (topical gel).  Antacid medicine.  Follow these instructions at home: Managing pain and discomfort  Rinse your mouth with a salt-water mixture 3-4 times a day or as needed. To make a salt-water mixture, completely dissolve -1 tsp of salt in 1 cup of warm water. This can help to reduce pain from the mouth sores. Your health care provider may also recommend other rinse solutions to treat mouth sores.  To relieve discomfort when you are eating: ? Try combinations of foods to see what you can tolerate. Aim for a balanced diet. ? Eat soft foods. These may be easier to swallow. ? Avoid foods and drinks that are salty, spicy, or acidic. ? Avoid alcohol. ? Try cold food and drinks, such as water, milk, milkshakes, frozen ice pops, slushies, and sherbets. Low-calorie sport drinks are good choices for staying hydrated. General instructions  Return to your normal activities as told by your health care provider. Ask your health care provider what activities are safe for you.  Take or apply over-the-counter and prescription medicines only as told by your health care provider.  Wash your hands often with soap and water. If soap and water are not available, use hand sanitizer.  Stay away from work, schools, or other group settings during the first few days of the illness, or until your fever is gone.  Keep all follow-up visits as told by your health care provider. This is important. Contact a health care provider if:  Your symptoms get worse or do not   improve within 2 weeks.  You have pain that does not get better with medicine.  You feel very irritable.  You have trouble swallowing.  You develop sores or blisters on your lips or outside of your mouth.  You have a fever for more than 3 days. Get help right away if:  You develop signs of severe dehydration, such as: ? Decreased urination. This means urinating only very small amounts or urinating fewer than 3 times in a 24-hour  period. ? Urine that is very dark. ? Dry mouth, tongue, or lips. ? Decreased tears or sunken eyes. ? Dry skin. ? Rapid breathing. ? Decreased activity or being very sleepy. ? Pale skin. ? Fingertips taking longer than 2 seconds to turn pink after a gentle squeeze. ? Weight loss.  You have a severe headache.  You have a stiff neck.  You experience changes in your behavior.  You have chest pain.  You have trouble breathing. Summary  Hand, foot, and mouth disease is a common viral illness.  This disease can spread from person to person (is contagious).  The illness often causes a sore throat, sores in the mouth, fever, and a rash on the hands and feet.  Typically, no treatment is needed for this condition. People usually get better within 2 weeks without treatment.  Get help right away if you develop signs of severe dehydration. This information is not intended to replace advice given to you by your health care provider. Make sure you discuss any questions you have with your health care provider. Document Released: 03/20/2016 Document Revised: 03/20/2016 Document Reviewed: 03/20/2016 Elsevier Interactive Patient Education  2018 [employer]  

## 2017-01-11 ENCOUNTER — Ambulatory Visit: Payer: BLUE CROSS/BLUE SHIELD | Admitting: Family Medicine

## 2017-01-25 ENCOUNTER — Ambulatory Visit: Payer: BLUE CROSS/BLUE SHIELD

## 2017-01-29 ENCOUNTER — Encounter: Payer: Self-pay | Admitting: Family Medicine

## 2017-02-02 ENCOUNTER — Other Ambulatory Visit: Payer: Self-pay | Admitting: Family Medicine

## 2017-02-02 MED ORDER — QUETIAPINE FUMARATE 25 MG PO TABS: 25.0000 mg | ORAL_TABLET | Freq: Every day | ORAL | 0 refills | Status: DC

## 2017-02-02 MED ORDER — SERTRALINE HCL 100 MG PO TABS: 200.0000 mg | ORAL_TABLET | Freq: Every day | ORAL | 0 refills | Status: DC

## 2017-03-12 ENCOUNTER — Ambulatory Visit (INDEPENDENT_AMBULATORY_CARE_PROVIDER_SITE_OTHER): Payer: BLUE CROSS/BLUE SHIELD | Admitting: Family Medicine

## 2017-03-12 ENCOUNTER — Encounter: Payer: Self-pay | Admitting: Family Medicine

## 2017-03-12 VITALS — BP 146/84 | HR 78 | Temp 98.6°F | Ht 66.0 in | Wt 239.5 lb

## 2017-03-12 DIAGNOSIS — R030 Elevated blood-pressure reading, without diagnosis of hypertension: Secondary | ICD-10-CM

## 2017-03-12 DIAGNOSIS — Z0000 Encounter for general adult medical examination without abnormal findings: Secondary | ICD-10-CM

## 2017-03-12 DIAGNOSIS — Z83438 Family history of other disorder of lipoprotein metabolism and other lipidemia: Secondary | ICD-10-CM | POA: Diagnosis not present

## 2017-03-12 DIAGNOSIS — F411 Generalized anxiety disorder: Secondary | ICD-10-CM

## 2017-03-12 DIAGNOSIS — Z7189 Other specified counseling: Secondary | ICD-10-CM

## 2017-03-12 MED ORDER — QUETIAPINE FUMARATE 25 MG PO TABS: 25.0000 mg | ORAL_TABLET | Freq: Every day | ORAL | 1 refills | Status: DC

## 2017-03-12 MED ORDER — PANTOPRAZOLE SODIUM 20 MG PO TBEC: 20.0000 mg | DELAYED_RELEASE_TABLET | Freq: Every day | ORAL | Status: DC | PRN

## 2017-03-12 MED ORDER — SERTRALINE HCL 100 MG PO TABS: 100.0000 mg | ORAL_TABLET | Freq: Every day | ORAL | 1 refills | Status: DC

## 2017-03-12 MED ORDER — SERTRALINE HCL 100 MG PO TABS: 100.0000 mg | ORAL_TABLET | Freq: Every day | ORAL | Status: DC

## 2017-03-12 NOTE — Progress Notes (Signed)
CPE- See plan.  Routine anticipatory guidance given to patient.  See health maintenance.  The possibility exists that previously documented standard health maintenance information may have been brought forward from a previous encounter into this note.  If needed, that same information has been updated to reflect the current situation based on today's encounter.    Tetanus 2017 Flu to be done at pharmacy.   PNA and shingles not due.   Colon cancer screening not due yet.    Breast cancer screening due, d/w pt.  See AVS.   DXA not due.  Pap smear 2017.  Never had an abnormal.  Living will d/w pt.  Husband designated if patient were incapacitated.  Diet and exercise d/w pt.  "I'm doing good, working with a trainer."   She is working on her diet.  HIV prev neg ~2008 per patient report.   BP was 125/80 at home.  Usually runs higher at the MD visits.   This is typical for patient.   Not smoking cigarettes.  Not using tobacco.  She is in the midst of tapering with E cig.    Mood d/w pt.  She hasn't used klonopin in the last year.  She has used only sparingly o/w.  Her psych MD was unavailable and I filled her meds in the meantime.  She was able to wean down to 100mg  sertraline and felt better.  No SI/HI.  Her home situation is going well.  She is working 2 jobs but able to manage that.  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  Plan for me to rx meds for 6 months to give patient time to [facility] or another psych MD.  She agrees.    PMH and SH reviewed  Meds, vitals, and allergies reviewed.   ROS: Per HPI.  Unless specifically indicated otherwise in HPI, the patient denies:  General: fever. Eyes: acute vision changes ENT: sore throat Cardiovascular: chest pain Respiratory: SOB GI: vomiting GU: dysuria Musculoskeletal: acute back pain Derm: acute rash Neuro: acute motor dysfunction Psych: worsening mood Endocrine: polydipsia Heme: bleeding Allergy: hayfever  GEN: nad,  alert and oriented HEENT: mucous membranes moist NECK: supple w/o LA CV: rrr. PULM: ctab, no inc wob ABD: soft, +bs EXT: no edema SKIN: no acute rash

## 2017-03-12 NOTE — Patient Instructions (Addendum)
You can call for a mammogram at [facility] at [facility].  [address] [place] [phone] [phone]  Go to the lab on the way out.  We'll contact you with your lab report. Thanks for your effort.  Take care.  Glad to see you.  Let me know if you end up needing a referral for psychiatry.

## 2017-03-13 ENCOUNTER — Other Ambulatory Visit: Payer: Self-pay | Admitting: Family Medicine

## 2017-03-13 LAB — COMPREHENSIVE METABOLIC PANEL
ALT: 12 U/L (ref 0–35)
AST: 12 U/L (ref 0–37)
Albumin: 4.7 g/dL (ref 3.5–5.2)
Alkaline Phosphatase: 59 U/L (ref 39–117)
BUN: 10 mg/dL (ref 6–23)
CO2: 29 mEq/L (ref 19–32)
Calcium: 9.7 mg/dL (ref 8.4–10.5)
Chloride: 101 mEq/L (ref 96–112)
Creatinine, Ser: 0.8 mg/dL (ref 0.40–1.20)
GFR: 82.5 mL/min (ref >60.00)
Glucose, Bld: 88 mg/dL (ref 70–99)
Potassium: 4.2 mEq/L (ref 3.5–5.1)
Sodium: 138 mEq/L (ref 135–145)
Total Bilirubin: 0.3 mg/dL (ref 0.2–1.2)
Total Protein: 7.5 g/dL (ref 6.0–8.3)

## 2017-03-13 LAB — LIPID PANEL
Cholesterol: 213 mg/dL — ABNORMAL HIGH (ref 0–200)
HDL: 51.3 mg/dL (ref >39.00)
LDL Cholesterol: 129 mg/dL — ABNORMAL HIGH (ref 0–99)
NonHDL: 161.21
Total CHOL/HDL Ratio: 4
Triglycerides: 159 mg/dL — ABNORMAL HIGH (ref 0.0–149.0)
VLDL: 31.8 mg/dL (ref 0.0–40.0)

## 2017-03-13 NOTE — Telephone Encounter (Signed)
Electronic refill request.  Last office visit:   03/12/17 Last Filled:   Sertraline

## 2017-03-13 NOTE — Assessment & Plan Note (Signed)
Normalized on home check.  Reasonable to recheck routine labs today, given her family history of hyperlipidemia.  She can continue to check her blood pressure at home and update me as needed.

## 2017-03-13 NOTE — Assessment & Plan Note (Signed)
Living will d/w pt.  Husband designated if patient were incapacitated.  

## 2017-03-13 NOTE — Assessment & Plan Note (Signed)
She hasn't used klonopin in the last year.  She has used only sparingly o/w.  Her psych MD was unavailable and I filled her meds in the meantime.  She was able to wean down to 100mg  sertraline and felt better.  No SI/HI.  Her home situation is going well.  She is working 2 jobs but able to manage that.  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  Plan for me to rx meds for 6 months to give patient time [facility].  She agrees.

## 2017-03-13 NOTE — Assessment & Plan Note (Signed)
Tetanus 2017 Flu to be done at pharmacy.   PNA and shingles not due.   Colon cancer screening not due yet.    Breast cancer screening due, d/w pt.  See AVS.   DXA not due.  Pap smear 2017.  Never had an abnormal.  Living will d/w pt.  Husband designated if patient were incapacitated.  Diet and exercise d/w pt.  "I'm doing good, working with a trainer."   She is working on her diet.  HIV prev neg ~2008 per patient report.

## 2017-03-26 ENCOUNTER — Other Ambulatory Visit: Payer: Self-pay | Admitting: Family Medicine

## 2017-03-29 ENCOUNTER — Other Ambulatory Visit: Payer: Self-pay | Admitting: Family Medicine

## 2017-06-06 ENCOUNTER — Other Ambulatory Visit: Payer: Self-pay | Admitting: General Surgery

## 2017-10-31 ENCOUNTER — Encounter: Payer: Self-pay | Admitting: Family Medicine

## 2017-11-04 ENCOUNTER — Other Ambulatory Visit: Payer: Self-pay | Admitting: Family Medicine

## 2017-11-04 DIAGNOSIS — F411 Generalized anxiety disorder: Secondary | ICD-10-CM

## 2017-11-04 MED ORDER — QUETIAPINE FUMARATE 25 MG PO TABS: 25.0000 mg | ORAL_TABLET | Freq: Every day | ORAL | 0 refills | Status: DC

## 2017-11-04 MED ORDER — SERTRALINE HCL 100 MG PO TABS: 100.0000 mg | ORAL_TABLET | Freq: Every day | ORAL | 0 refills | Status: DC

## 2018-01-28 ENCOUNTER — Other Ambulatory Visit: Payer: Self-pay | Admitting: Family Medicine

## 2018-01-28 NOTE — Telephone Encounter (Signed)
Electronic refill request. Seroquel Last office visit:   03/12/17 CPE Last Filled:    90 tablet 0 11/04/2017  Please advise.   

## 2018-01-29 NOTE — Telephone Encounter (Signed)
rx needs to come through psychiatry.  Let me know if she hasn't seen psych.  I denied the rx in the meantime.  Thanks.

## 2018-02-18 ENCOUNTER — Telehealth: Payer: Self-pay | Admitting: Family Medicine

## 2018-02-18 NOTE — Telephone Encounter (Signed)
Form placed in Dr. Duncan's In Box. 

## 2018-02-18 NOTE — Telephone Encounter (Signed)
Pt dropped off Preventive Healthcare Information Form. She states she needs it as soon as possible. Form placed in [facility] in front office.

## 2018-02-19 NOTE — Telephone Encounter (Signed)
Patient advised.  Copy made for scanning and form left at front desk for pickup.

## 2018-02-19 NOTE — Telephone Encounter (Signed)
Form done.  Please have her get a flu shot if not already done.  Thanks.

## 2018-02-25 ENCOUNTER — Other Ambulatory Visit: Payer: Self-pay | Admitting: Family Medicine

## 2018-02-25 ENCOUNTER — Other Ambulatory Visit: Payer: BLUE CROSS/BLUE SHIELD

## 2018-02-25 DIAGNOSIS — E785 Hyperlipidemia, unspecified: Secondary | ICD-10-CM

## 2018-03-01 ENCOUNTER — Encounter: Payer: BLUE CROSS/BLUE SHIELD | Admitting: Family Medicine

## 2018-03-03 ENCOUNTER — Other Ambulatory Visit: Payer: Self-pay | Admitting: Family Medicine

## 2018-03-04 NOTE — Telephone Encounter (Signed)
Please check with patient on this.  She was going to follow-up with psychiatry to address this.  Thanks.

## 2018-03-04 NOTE — Telephone Encounter (Signed)
Electronic refill request. Seroquel Last office visit:   03/12/17 CPE Last Filled:    90 tablet 0 11/04/2017  Please advise.

## 2018-03-05 NOTE — Telephone Encounter (Signed)
Left detailed message on voicemail to call back with info. 

## 2018-03-08 NOTE — Telephone Encounter (Signed)
Patient says this must have been an auto refill thing and that she does not need the medication. Patient has appointment with psychiatry this Monday, January 27th.

## 2018-03-10 NOTE — Telephone Encounter (Signed)
Noted. Thanks.  I'll defer/await notes.

## 2018-03-11 DIAGNOSIS — F330 Major depressive disorder, recurrent, mild: Secondary | ICD-10-CM | POA: Diagnosis not present

## 2018-03-11 DIAGNOSIS — F5105 Insomnia due to other mental disorder: Secondary | ICD-10-CM | POA: Diagnosis not present

## 2018-03-11 DIAGNOSIS — F411 Generalized anxiety disorder: Secondary | ICD-10-CM | POA: Diagnosis not present

## 2018-04-01 DIAGNOSIS — F330 Major depressive disorder, recurrent, mild: Secondary | ICD-10-CM | POA: Diagnosis not present

## 2018-04-01 DIAGNOSIS — F5105 Insomnia due to other mental disorder: Secondary | ICD-10-CM | POA: Diagnosis not present

## 2018-04-01 DIAGNOSIS — F411 Generalized anxiety disorder: Secondary | ICD-10-CM | POA: Diagnosis not present

## 2018-04-22 DIAGNOSIS — F5105 Insomnia due to other mental disorder: Secondary | ICD-10-CM | POA: Diagnosis not present

## 2018-04-22 DIAGNOSIS — Z79899 Other long term (current) drug therapy: Secondary | ICD-10-CM | POA: Diagnosis not present

## 2018-04-22 DIAGNOSIS — R5383 Other fatigue: Secondary | ICD-10-CM | POA: Diagnosis not present

## 2018-04-22 DIAGNOSIS — F411 Generalized anxiety disorder: Secondary | ICD-10-CM | POA: Diagnosis not present

## 2018-04-22 DIAGNOSIS — F330 Major depressive disorder, recurrent, mild: Secondary | ICD-10-CM | POA: Diagnosis not present

## 2018-05-03 ENCOUNTER — Other Ambulatory Visit: Payer: Self-pay | Admitting: Family Medicine

## 2018-05-03 ENCOUNTER — Encounter: Payer: Self-pay | Admitting: Family Medicine

## 2018-05-03 DIAGNOSIS — E538 Deficiency of other specified B group vitamins: Secondary | ICD-10-CM | POA: Insufficient documentation

## 2018-05-03 LAB — LAB REPORT - SCANNED
A1c: 5.2
Folate: 6.7
TSH: 1.99
VITAMIN B12: 185

## 2018-05-06 ENCOUNTER — Ambulatory Visit (INDEPENDENT_AMBULATORY_CARE_PROVIDER_SITE_OTHER): Payer: BLUE CROSS/BLUE SHIELD | Admitting: Family Medicine

## 2018-05-06 ENCOUNTER — Other Ambulatory Visit: Payer: Self-pay

## 2018-05-06 DIAGNOSIS — E538 Deficiency of other specified B group vitamins: Secondary | ICD-10-CM

## 2018-05-06 MED ORDER — SERTRALINE HCL 100 MG PO TABS: 150.0000 mg | ORAL_TABLET | Freq: Every day | ORAL | Status: DC

## 2018-05-06 MED ORDER — CYANOCOBALAMIN 1000 MCG/ML IJ SOLN: INTRAMUSCULAR | 0 refills | Status: DC

## 2018-05-06 MED ORDER — "INSULIN SYRINGE-NEEDLE U-100 25G X 1"" 1 ML MISC": 0 refills | Status: DC

## 2018-05-06 NOTE — Progress Notes (Signed)
This service was provided by telemedicine.  Location of patient: home   Patient consented to encounter: yes Location of MD: [facility] Name of referring provider (if blank then none associated): Names per persons and role in encounter:  MD: Ferd Hibbs, Patient: name listed above.   Time on call: [phone]  CC: B12 def/follow up.    HPI:  Recent sertraline inc per psych clinic.  Stressors noted with pandemic but she is managing.    Fatigue noted.  This was atypical for patient.  She assumed that fatigue isn't solely due to depression.  "my body feels heavy."  She isn't on PPI.  She had cut back on meat intake, down to almost none in the last 3 years.  Not on PPI.    Recent labs d/w pt.  VITAMIN B12 185   Folate 6.7   TSH 1.990   A1c 5.2    D/w pt about B12 path/phys and replacement.  Her son is a Company secretary and can give injection at home.    Meds and allergies reviewed.   ROS: Per HPI unless specifically indicated in ROS section   No physical exam.    Plan:  B12 def.  Would try IM replacement weekly x4 doses, then monthly with recheck trough B12 level in about 3 months.  rx sent.  Lab ordered.  We may be able to transition to oral B12 after that given her diet.  All questions answered.  She'll update me as needed. Hopefully her fatigue will improve with tx.

## 2018-05-06 NOTE — Assessment & Plan Note (Signed)
B12 def.  Would try IM replacement weekly x4 doses, then monthly with recheck trough B12 level in about 3 months.  rx sent.  Lab ordered.  We may be able to transition to oral B12 after that given her diet.  All questions answered.  She'll update me as needed. Hopefully her fatigue will improve with tx.

## 2018-06-17 DIAGNOSIS — F5105 Insomnia due to other mental disorder: Secondary | ICD-10-CM | POA: Diagnosis not present

## 2018-06-17 DIAGNOSIS — F330 Major depressive disorder, recurrent, mild: Secondary | ICD-10-CM | POA: Diagnosis not present

## 2018-06-17 DIAGNOSIS — F411 Generalized anxiety disorder: Secondary | ICD-10-CM | POA: Diagnosis not present

## 2018-06-27 DIAGNOSIS — J069 Acute upper respiratory infection, unspecified: Secondary | ICD-10-CM | POA: Diagnosis not present

## 2018-06-30 DIAGNOSIS — S8001XA Contusion of right knee, initial encounter: Secondary | ICD-10-CM | POA: Diagnosis not present

## 2018-06-30 DIAGNOSIS — M25561 Pain in right knee: Secondary | ICD-10-CM | POA: Diagnosis not present

## 2018-07-01 ENCOUNTER — Telehealth: Payer: Self-pay

## 2018-07-01 NOTE — Telephone Encounter (Signed)
[facility] Night - Client TELEPHONE ADVICE RECORD [facility] Medical Call Center Patient Name: Maria Lawson Gender: Female DOB: June 07, 1970 Age: 48 Y 1 M 18 D Return Phone Number: [phone] (Primary), [phone] (Secondary) Address: [place] [address] Client [facility] Night - Client Client Site [facility] - Night Physician Raechel Ache - MD Contact Type Call Who Is Calling Patient / Member / Family / Caregiver Call Type Triage / Clinical Relationship To Patient Self Return Phone Number [phone] (Primary) Chief Complaint Knee Injury Reason for Call Symptomatic / Request for Health Information Initial Comment Caller states, she has a injured muscle in her knee. Tight muscle. Walking issues. High pain. Translation No Nurse Assessment Nurse: Leana Roe, RN, Bonita Quin Date/Time (Eastern Time): 06/30/2018 9:33:19 AM Confirm and document reason for call. If symptomatic, describe symptoms. ---Caller states, she has a injured muscle in her knee. Tight muscle. Walking issues. High pain. Patient reports woke up with pain yesterday morning after sitting with legs under her for a long time the night before. Reports no improvement since yesterday, cannot bear weight or straighten. No other symptoms. Has the patient had close contact with a person known or suspected to have the novel coronavirus illness OR traveled / lives in area with major community spread (including international travel) in the last 14 days from the onset of symptoms? * If Asymptomatic, screen for exposure and travel within the last 14 days. ---No Does the patient have any new or worsening symptoms? ---Yes Will a triage be completed? ---Yes Related visit to physician within the last 2 weeks? ---No Does the PT have any chronic conditions? (i.e. diabetes, asthma, this includes High risk factors for pregnancy, etc.) ---No Is the patient  pregnant or possibly pregnant? (Ask all females between the ages of 73-55) ---No Is this a behavioral health or substance abuse call? ---No Guidelines Guideline Title Affirmed Question Affirmed Notes Nurse Date/Time (Eastern Time) Knee Injury Can't stand (bear weight) or walk Leana Roe, Education officer, museum 06/30/2018 9:37:19 AM Disp. Time Lamount Cohen Time) Disposition Final User PLEASE NOTE: All timestamps contained within this report are represented as [place] Standard Time. CONFIDENTIALTY NOTICE: This fax transmission is intended only for the addressee. It contains information that is legally privileged, confidential or otherwise protected from use or disclosure. If you are not the intended recipient, you are strictly prohibited from reviewing, disclosing, copying using or disseminating any of this information or taking any action in reliance on or regarding this information. If you have received this fax in error, please notify us immediately by telephone so that we can arrange for its return to [place]. Phone: [phone], Toll-Free: [phone], Fax: [phone] Page: 2 of 2 Call Id: [id number] 06/30/2018 9:40:33 AM Go to ED Now Yes Leana Roe, RN, Camillia Herter Disagree/Comply Disagree Caller Understands Yes PreDisposition Call Doctor Care Advice Given Per Guideline GO TO ED NOW: * You need to be seen in the Emergency Department. NOTE TO TRIAGER - DRIVING: * Another adult should drive. CARE ADVICE given per Knee Injury (Adult) guideline. Comments User: Rich Fuchs, RN Date/Time Lamount Cohen Time): 06/30/2018 9:41:52 AM patient will possibly go to urgent care, does not want to go to ER Referrals [facility] - ED

## 2018-07-01 NOTE — Telephone Encounter (Signed)
Patient was seen by urgent care yesterday for her knee.  She was not happy with her care there and wants to follow up with Dr. Para March if not improving.   Patient is alternating tylenol and advil along with heat/ice, rest and elevation.  As she is not improving, she wants to go ahead and make office visit to see Dr. Para March.  In office visit scheduled for tomorrow (07/02/18) at 3pm.  Patient is negative for all COVID screening symptoms and will wear a mask.   FYI to Dr. Para March.

## 2018-07-02 ENCOUNTER — Encounter: Payer: Self-pay | Admitting: Family Medicine

## 2018-07-02 ENCOUNTER — Ambulatory Visit: Payer: BLUE CROSS/BLUE SHIELD | Admitting: Family Medicine

## 2018-07-02 ENCOUNTER — Other Ambulatory Visit: Payer: Self-pay

## 2018-07-02 DIAGNOSIS — M79606 Pain in leg, unspecified: Secondary | ICD-10-CM | POA: Diagnosis not present

## 2018-07-02 MED ORDER — CYANOCOBALAMIN 1000 MCG/ML IJ SOLN: INTRAMUSCULAR | Status: DC

## 2018-07-02 NOTE — Patient Instructions (Signed)
Try ice alone.   If that isn't working well, then try alternating ice and heat.   This should get better.  Limit walking for now.  Activity as tolerated.  Use sleeve when weight bearing.   If you get diclofenac, then stop ibuprofen.  Don't take them together.  If you use either, take with food.  Alternate with tylenol.    Take care.  Glad to see you.

## 2018-07-02 NOTE — Progress Notes (Signed)
She isn't due for B12 recheck yet.  She has more energy after each injection.    R knee pain.  She had a long period of time, sitting cross legged, on the phone. It was sore after the call, but not prior.  No injury o/w.  Then that night woke from pain.  Pain bearing weight the next day.  "It felt like the worst cramp I'd had."  She has been taking tylenol and advil in the meantime.  Seen at [facility].  Hasn't started diclofenac yet.    No locking or clicking.  No bruising.  Able to bear weight better now, but still with pain.  Less pain sitting in a chair, more pain walking.  Still with pain with full extension.  R knee has been a little puffy.    She has more pain after trying to stretch this AM.    Her mother was dx'd with breast cancer.  She was going to try to get tested for covid prior to travel.  I asked her to call [place] when she is going to travel so we can see about testing at that point.  She doesn't have sx.    Meds, vitals, and allergies reviewed.   ROS: Per HPI unless specifically indicated in ROS section   nad R knee minimally puffy but not bruised.  No erythema. Normal range of motion.  She has full extension but some discomfort with that.  She has intact flexion. Medial and lateral joint line not tender. Patella not tender. She is able to bear weight. She has some tenderness on the right distal hamstring especially laterally. ACL, MCL, LCL feel intact and solid.

## 2018-07-02 NOTE — Telephone Encounter (Signed)
Noted. Thanks.

## 2018-07-03 DIAGNOSIS — M79606 Pain in leg, unspecified: Secondary | ICD-10-CM | POA: Insufficient documentation

## 2018-07-03 NOTE — Assessment & Plan Note (Signed)
I do not think this is a knee problem per se. More likely a strain.  Should resolve. Try ice alone.   If that isn't working well, then she can try alternating ice and heat.   This should get better.  Limit walking for now.  Activity as tolerated.  Use a sleeve when weight bearing.   Routine NSAID cautions given to patient.  She can alternate with tylenol.

## 2018-07-19 ENCOUNTER — Ambulatory Visit (INDEPENDENT_AMBULATORY_CARE_PROVIDER_SITE_OTHER): Payer: BC Managed Care – PPO | Admitting: Family Medicine

## 2018-07-19 ENCOUNTER — Other Ambulatory Visit: Payer: Self-pay

## 2018-07-19 ENCOUNTER — Encounter: Payer: Self-pay | Admitting: Family Medicine

## 2018-07-19 VITALS — BP 144/78 | HR 86 | Temp 98.6°F | Ht 65.25 in | Wt 237.4 lb

## 2018-07-19 DIAGNOSIS — E538 Deficiency of other specified B group vitamins: Secondary | ICD-10-CM

## 2018-07-19 DIAGNOSIS — Z0000 Encounter for general adult medical examination without abnormal findings: Secondary | ICD-10-CM | POA: Diagnosis not present

## 2018-07-19 DIAGNOSIS — Z111 Encounter for screening for respiratory tuberculosis: Secondary | ICD-10-CM

## 2018-07-19 DIAGNOSIS — Z862 Personal history of diseases of the blood and blood-forming organs and certain disorders involving the immune mechanism: Secondary | ICD-10-CM | POA: Diagnosis not present

## 2018-07-19 DIAGNOSIS — Z7189 Other specified counseling: Secondary | ICD-10-CM

## 2018-07-19 DIAGNOSIS — E785 Hyperlipidemia, unspecified: Secondary | ICD-10-CM

## 2018-07-19 DIAGNOSIS — F411 Generalized anxiety disorder: Secondary | ICD-10-CM

## 2018-07-19 NOTE — Progress Notes (Signed)
CPE- See plan.  Routine anticipatory guidance given to patient.  See health maintenance.  The possibility exists that previously documented standard health maintenance information may have been brought forward from a previous encounter into this note.  If needed, that same information has been updated to reflect the current situation based on today's encounter.    Tetanus 2017 Flu to be done at pharmacy.   PNA and shingles not due.  Colon cancer screening not due yet.   Mammogram due, d/w pt.  DXA not due.  Pap smear 2017. Never had an abnormal.  Living will d/w pt. Oldest son designated if patient were incapacitated.   Diet and exercise d/w pt.  HIV prev neg ~2008 per patient report.   Form for work filled out.  No reason to preclude work.  TB test pending.  She has h/o anemia in the distant past.  D/w pt.  See orders.   She is still seeing psychiatry, Dr. Maryruth Bun.  She is having telemed visits with her.  I'll defer, patient agrees.  Mood is stable.  Mother going through breast cancer treatment, father with dementia.  She is trying to adjust to all the changes in her life.  She can check her BP at home and update me as needed.  Usually 120/70s on home check.  B12 def.  She had weekly then monthly injection.  She clearly feels better after injection.  The effect seems not to last until the next shot.  Fatigue noted prev. D/w pt. See avs.   Her R knee is slowly getting better.  She is using a brace.  He has been using exercises at home.  She had prev used ice.  She has been elevating her leg as needed.   She will update me as needed.  PMH and SH reviewed  Meds, vitals, and allergies reviewed.   ROS: Per HPI.  Unless specifically indicated otherwise in HPI, the patient denies:  General: fever. Eyes: acute vision changes ENT: sore throat Cardiovascular: chest pain Respiratory: SOB GI: vomiting GU: dysuria Musculoskeletal: acute back pain Derm: acute rash Neuro: acute motor  dysfunction Psych: worsening mood Endocrine: polydipsia Heme: bleeding Allergy: hayfever  GEN: nad, alert and oriented HEENT: mucous membranes moist NECK: supple w/o LA CV: rrr. PULM: ctab, no inc wob ABD: soft, +bs EXT: no edema SKIN: no acute rash Right knee and flexible knee brace.

## 2018-07-19 NOTE — Patient Instructions (Addendum)
Plan on recheck B12 level in about 1 month prior to that month's dose.   However, if you feel a lot worse in the meantime, then let us know to check the level early.   You can call for a mammogram at  [facility] at [facility].  [address] [place] [phone]  Recheck CMET CBC iron and lipids with the B12 level.   Go to the lab on the way out.  We'll contact you with your lab report. TB test today.  Take care.  Glad to see you.

## 2018-07-22 DIAGNOSIS — Z862 Personal history of diseases of the blood and blood-forming organs and certain disorders involving the immune mechanism: Secondary | ICD-10-CM | POA: Insufficient documentation

## 2018-07-22 NOTE — Assessment & Plan Note (Signed)
Tetanus 2017 Flu to be done at pharmacy.   PNA and shingles not due.  Colon cancer screening not due yet.   Mammogram due, d/w pt.  DXA not due.  Pap smear 2017. Never had an abnormal.  Living will d/w pt. Oldest son designated if patient were incapacitated.   Diet and exercise d/w pt.  HIV prev neg ~2008 per patient report.

## 2018-07-22 NOTE — Assessment & Plan Note (Signed)
She is still seeing psychiatry, Dr. Nicolasa Ducking.  She is having telemed visits with her.  I'll defer, patient agrees.  Mood is stable.  Mother going through breast cancer treatment, father with dementia.  She is trying to adjust to all the changes in her life.

## 2018-07-22 NOTE — Assessment & Plan Note (Signed)
She has h/o anemia in the distant past.  D/w pt.  See orders.

## 2018-07-22 NOTE — Assessment & Plan Note (Signed)
Living will d/w pt. Oldest son designated if patient were incapacitated.   

## 2018-07-22 NOTE — Assessment & Plan Note (Signed)
  B12 def.  She had weekly then monthly injection.  She clearly feels better after injection.  The effect seems not to last until the next shot.  Fatigue noted prev. D/w pt. See avs.

## 2018-07-23 ENCOUNTER — Other Ambulatory Visit: Payer: Self-pay | Admitting: Family Medicine

## 2018-07-23 LAB — QUANTIFERON-TB GOLD PLUS
Mitogen-NIL: 9.98 IU/mL
NIL: 0.04 IU/mL
QuantiFERON-TB Gold Plus: NEGATIVE
TB1-NIL: 0.01 IU/mL
TB2-NIL: 0.01 IU/mL

## 2018-07-23 NOTE — Telephone Encounter (Signed)
Last office visit 07/19/2018 for CPE.  Last refilled?   Last Vit B12 level low at 185 pg/ml on 04/22/2018 by Dr. Nicolasa Ducking.  Patient has lab appointment scheduled for 08/09/2018.  Ok to refill?

## 2018-07-24 NOTE — Telephone Encounter (Signed)
rx sent.  She has f/u labs pending.  Thanks.

## 2018-08-08 ENCOUNTER — Encounter: Payer: Self-pay | Admitting: Family Medicine

## 2018-08-08 ENCOUNTER — Ambulatory Visit (INDEPENDENT_AMBULATORY_CARE_PROVIDER_SITE_OTHER): Payer: BC Managed Care – PPO | Admitting: Family Medicine

## 2018-08-08 VITALS — Temp 98.2°F

## 2018-08-08 DIAGNOSIS — J028 Acute pharyngitis due to other specified organisms: Secondary | ICD-10-CM

## 2018-08-08 DIAGNOSIS — B9789 Other viral agents as the cause of diseases classified elsewhere: Secondary | ICD-10-CM | POA: Diagnosis not present

## 2018-08-08 NOTE — Progress Notes (Signed)
I connected with Maria Lawson on 08/08/18 at  9:40 AM EDT by video and verified that I am speaking with the correct person using two identifiers.   I discussed the limitations, risks, security and privacy concerns of performing an evaluation and management service by video and the availability of in person appointments. I also discussed with the patient that there may be a patient responsible charge related to this service. The patient expressed understanding and agreed to proceed.  Patient location: Home Provider Location: [facility] Participants: Maria Lawson and Maria Lawson   Subjective:     Maria Lawson is a 48 y.o. female presenting for Mouth Lesions (symptoms started on 08/06/2018. 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 Negative for congestion and sneezing.   Respiratory: Positive for cough.   Skin: Negative for rash.       itching     Social History   Tobacco Use  Smoking Status Current Every Day Smoker  . Packs/day: 0.00  . Years: 0.00  . Pack years: 0.00  . Types: E-cigarettes  Smokeless Tobacco Never Used  Tobacco Comment   age 28 quit        Objective:   BP Readings from Last 3 Encounters:  07/19/18 (!) 144/78  07/02/18 (!) 146/74  03/12/17 (!) 146/84   Wt Readings from Last 3 Encounters:  07/19/18 237 lb 7 oz (107.7 kg)  07/02/18 241 lb 7 oz (109.5 kg)  03/12/17 239 lb 8 oz  (108.6 kg)   Temp 98.2 F (36.8 C) Comment: per patient  LMP 07/25/2018    Physical Exam Constitutional:      Appearance: Normal appearance. She is not ill-appearing.  HENT:     Head: Normocephalic and atraumatic.     Right Ear: External ear normal.     Left Ear: External ear normal.     Mouth/Throat:     Comments: Able to see erythema on throat, but unable to visualize distinct lesions Eyes:     Conjunctiva/sclera: Conjunctivae normal.  Pulmonary:     Effort: Pulmonary effort is normal. No respiratory distress.  Skin:    Comments: Hands without rash or lesions  Neurological:     Mental Status: She is alert. Mental status is at baseline.  Psychiatric:        Mood and Affect: Mood normal.        Behavior: Behavior normal.        Thought Content: Thought content normal.        Judgment: Judgment normal.             Assessment & Plan:   Problem List Items Addressed This Visit      Respiratory   Viral sore throat - Primary    Difficulty visualizing lesions and no hand/foot lesions at  this time to confirm Hand, Foot, Mouth disease but given patient's hx of prior episode provided guidance as though this was the cause. Offered Covid-19 testing (though less likely), she said if she developed new or concerning symptoms she would call back to pursue testing to rule out. Advised wearing mask and hand hygiene as hand/foot/mouth can be contagious for week, but less so when not having fevers.           Return if symptoms worsen or fail to improve.  Lynnda ChildJessica R Isiaah Cuervo, MD

## 2018-08-08 NOTE — Assessment & Plan Note (Signed)
Difficulty visualizing lesions and no hand/foot lesions at this time to confirm Hand, Foot, Mouth disease but given patient's hx of prior episode provided guidance as though this was the cause. Offered Covid-19 testing (though less likely), she said if she developed new or concerning symptoms she would call back to pursue testing to rule out. Advised wearing mask and hand hygiene as hand/foot/mouth can be contagious for week, but less so when not having fevers.

## 2018-08-09 ENCOUNTER — Other Ambulatory Visit: Payer: BC Managed Care – PPO

## 2018-08-15 ENCOUNTER — Other Ambulatory Visit (INDEPENDENT_AMBULATORY_CARE_PROVIDER_SITE_OTHER): Payer: BC Managed Care – PPO

## 2018-08-15 DIAGNOSIS — E785 Hyperlipidemia, unspecified: Secondary | ICD-10-CM

## 2018-08-15 DIAGNOSIS — E538 Deficiency of other specified B group vitamins: Secondary | ICD-10-CM

## 2018-08-15 DIAGNOSIS — Z862 Personal history of diseases of the blood and blood-forming organs and certain disorders involving the immune mechanism: Secondary | ICD-10-CM | POA: Diagnosis not present

## 2018-08-15 LAB — CBC WITH DIFFERENTIAL/PLATELET
Basophils Absolute: 0 10*3/uL (ref 0.0–0.1)
Basophils Relative: 0.7 % (ref 0.0–3.0)
Eosinophils Absolute: 0.1 10*3/uL (ref 0.0–0.7)
Eosinophils Relative: 2.1 % (ref 0.0–5.0)
HCT: 37.9 % (ref 36.0–46.0)
Hemoglobin: 12.4 g/dL (ref 12.0–15.0)
Lymphocytes Relative: 12.6 % (ref 12.0–46.0)
Lymphs Abs: 0.9 10*3/uL (ref 0.7–4.0)
MCHC: 32.9 g/dL (ref 30.0–36.0)
MCV: 84.2 fl (ref 78.0–100.0)
Monocytes Absolute: 0.6 10*3/uL (ref 0.1–1.0)
Monocytes Relative: 8.8 % (ref 3.0–12.0)
Neutro Abs: 5.3 10*3/uL (ref 1.4–7.7)
Neutrophils Relative %: 75.8 % (ref 43.0–77.0)
Platelets: 253 10*3/uL (ref 150.0–400.0)
RBC: 4.5 Mil/uL (ref 3.87–5.11)
RDW: 14.2 % (ref 11.5–15.5)
WBC: 7 10*3/uL (ref 4.0–10.5)

## 2018-08-15 LAB — COMPREHENSIVE METABOLIC PANEL
ALT: 17 U/L (ref 0–35)
AST: 13 U/L (ref 0–37)
Albumin: 4.2 g/dL (ref 3.5–5.2)
Alkaline Phosphatase: 60 U/L (ref 39–117)
BUN: 7 mg/dL (ref 6–23)
CO2: 23 mEq/L (ref 19–32)
Calcium: 8.6 mg/dL (ref 8.4–10.5)
Chloride: 106 mEq/L (ref 96–112)
Creatinine, Ser: 0.67 mg/dL (ref 0.40–1.20)
GFR: 94.65 mL/min (ref >60.00)
Glucose, Bld: 90 mg/dL (ref 70–99)
Potassium: 4 mEq/L (ref 3.5–5.1)
Sodium: 137 mEq/L (ref 135–145)
Total Bilirubin: 0.3 mg/dL (ref 0.2–1.2)
Total Protein: 6.8 g/dL (ref 6.0–8.3)

## 2018-08-15 LAB — IRON: Iron: 45 ug/dL (ref 42–145)

## 2018-08-15 LAB — LIPID PANEL
Cholesterol: 171 mg/dL (ref 0–200)
HDL: 50 mg/dL (ref >39.00)
LDL Cholesterol: 86 mg/dL (ref 0–99)
NonHDL: 121.19
Total CHOL/HDL Ratio: 3
Triglycerides: 174 mg/dL — ABNORMAL HIGH (ref 0.0–149.0)
VLDL: 34.8 mg/dL (ref 0.0–40.0)

## 2018-08-15 LAB — VITAMIN B12: Vitamin B-12: 160 pg/mL — ABNORMAL LOW (ref 211–911)

## 2018-08-17 ENCOUNTER — Other Ambulatory Visit: Payer: Self-pay | Admitting: Family Medicine

## 2018-08-17 DIAGNOSIS — E538 Deficiency of other specified B group vitamins: Secondary | ICD-10-CM

## 2018-08-17 MED ORDER — CYANOCOBALAMIN 1000 MCG/ML IJ SOLN: INTRAMUSCULAR | 1 refills | Status: DC

## 2018-09-19 DIAGNOSIS — F330 Major depressive disorder, recurrent, mild: Secondary | ICD-10-CM | POA: Diagnosis not present

## 2018-09-19 DIAGNOSIS — F411 Generalized anxiety disorder: Secondary | ICD-10-CM | POA: Diagnosis not present

## 2018-09-19 DIAGNOSIS — F5105 Insomnia due to other mental disorder: Secondary | ICD-10-CM | POA: Diagnosis not present

## 2018-11-20 ENCOUNTER — Telehealth: Payer: Self-pay

## 2018-11-20 ENCOUNTER — Other Ambulatory Visit: Payer: Self-pay

## 2018-11-20 ENCOUNTER — Ambulatory Visit: Payer: BC Managed Care – PPO | Admitting: Family Medicine

## 2018-11-20 NOTE — Telephone Encounter (Signed)
Starting on 11/16/18 pt started with S/T, mild fever, prod cough with yellow phlegm and a dry cough also. 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 Pt has very congested chest; pt said when lays down she is SOB to the point she feels like she is smothering. Does not think she has fever today,No CP but is having wheezing. Pt will go to UC in [place] for face to face visit and CXR if needed.FYI to Dr Damita Dunnings.

## 2018-11-20 NOTE — Telephone Encounter (Signed)
Noted.  Agree with eval today.  I am currently out of the office.  I will await the notes from urgent care.

## 2018-11-21 DIAGNOSIS — Z1159 Encounter for screening for other viral diseases: Secondary | ICD-10-CM | POA: Diagnosis not present

## 2018-11-21 DIAGNOSIS — J019 Acute sinusitis, unspecified: Secondary | ICD-10-CM | POA: Diagnosis not present

## 2019-01-21 ENCOUNTER — Ambulatory Visit (INDEPENDENT_AMBULATORY_CARE_PROVIDER_SITE_OTHER): Payer: BC Managed Care – PPO | Admitting: Internal Medicine

## 2019-01-21 ENCOUNTER — Other Ambulatory Visit: Payer: Self-pay

## 2019-01-21 ENCOUNTER — Encounter: Payer: Self-pay | Admitting: Internal Medicine

## 2019-01-21 VITALS — Temp 98.7°F

## 2019-01-21 DIAGNOSIS — R509 Fever, unspecified: Secondary | ICD-10-CM | POA: Diagnosis not present

## 2019-01-21 DIAGNOSIS — R05 Cough: Secondary | ICD-10-CM | POA: Diagnosis not present

## 2019-01-21 DIAGNOSIS — R52 Pain, unspecified: Secondary | ICD-10-CM

## 2019-01-21 DIAGNOSIS — R0981 Nasal congestion: Secondary | ICD-10-CM | POA: Diagnosis not present

## 2019-01-21 DIAGNOSIS — R059 Cough, unspecified: Secondary | ICD-10-CM

## 2019-01-21 MED ORDER — HYDROCODONE-HOMATROPINE 5-1.5 MG/5ML PO SYRP: 5.0000 mL | ORAL_SOLUTION | Freq: Three times a day (TID) | ORAL | 0 refills | Status: DC | PRN

## 2019-01-21 MED ORDER — PREDNISONE 10 MG PO TABS: ORAL_TABLET | ORAL | 0 refills | Status: DC

## 2019-01-21 NOTE — Progress Notes (Signed)
Virtual Visit via Video Note  I connected with [name] on 01/21/19 at  4:15 PM EST by a video enabled telemedicine application and verified that I am speaking with the correct person using two identifiers.  Location: Patient: Home Provider: Office   I discussed the limitations of evaluation and management by telemedicine and the availability of in person appointments. The patient expressed understanding and agreed to proceed.  History of Present Illness:  Pt reports nasal congestion, cough and body aches. This started 4-5 days ago. She is not able to blow anything out of her nose. The cough is productive at times of yellow mucous. She denies headache, facial pain and pressure, runny nose, ear pain, loss of taste or smell, shortness of breath, nausea, vomiting or diarrhea. She had a fever of 99.5 yesterday, none today. She has tried Tylenol, Zyrtec, Flonase and Mucinex with minimal relief. She has not had sick contacts or exposure to COVID that she is aware of, but she does work in a daycare. She reports she has really bad allergies and this feels like a sinus infection.    Past Medical History:  Diagnosis Date  . Allergy 10/12/1998  . Anxiety 12/14/2004  . Migraine with aura   . Panic attack     Current Outpatient Medications  Medication Sig Dispense Refill  . cetirizine (ZYRTEC) 10 MG tablet Take 1 tablet (10 mg total) by mouth daily.    . cyanocobalamin (,VITAMIN B-12,) 1000 MCG/ML injection INJECT 1 ML INTO THE MUSCLE EVERY 14 DAYS 6 mL 1  . fluticasone (FLONASE) 50 MCG/ACT nasal spray Place 2 sprays into both nostrils daily.    Marland Kitchen guaifenesin (HUMIBID E) 400 MG TABS tablet Take 400 mg by mouth every 6 (six) hours.    . Insulin Syringe-Needle U-100 (B-D INSULIN SYRINGE 1CC/25GX1") 25G X 1" 1 ML MISC Use with B12 injection. 10 each 0  . QUEtiapine (SEROQUEL) 25 MG tablet Take 1 tablet (25 mg total) by mouth at bedtime. 90 tablet 0  . sertraline (ZOLOFT) 100 MG tablet Take 1.5  tablets (150 mg total) by mouth daily.    Marland Kitchen HYDROcodone-homatropine (HYCODAN) 5-1.5 MG/5ML syrup Take 5 mLs by mouth every 8 (eight) hours as needed for cough. 120 mL 0  . predniSONE (DELTASONE) 10 MG tablet Take 6 tabs day 1, 5 tabs day 2, 4 tabs day 3, 3 tabs day 4, 2 tabs day 5, 1 tab day 6 21 tablet 0   No current facility-administered medications for this visit.     Allergies  Allergen Reactions  . Latex     REACTION: rash    Family History  Problem Relation Age of Onset  . Hyperlipidemia Mother   . Breast cancer Mother   . Hypertension Father   . Hyperlipidemia Father   . Memory loss Father   . Colon cancer Maternal Uncle     Social History   Socioeconomic History  . Marital status: Married    Spouse name: Not on file  . Number of children: 3  . Years of education: Not on file  . Highest education level: Not on file  Occupational History  . Occupation: Former Research scientist (life sciences): UNEMPLOYED  Social Needs  . Financial resource strain: Not on file  . Food insecurity    Worry: Not on file    Inability: Not on file  . Transportation needs    Medical: Not on file    Non-medical: Not on file  Tobacco  Use  . Smoking status: Current Every Day Smoker    Packs/day: 0.00    Years: 0.00    Pack years: 0.00    Types: E-cigarettes  . Smokeless tobacco: Never Used  . Tobacco comment: age 48 quit  Substance and Sexual Activity  . Alcohol use: Yes    Alcohol/week: 0.0 standard drinks    Comment: rare  . Drug use: No  . Sexual activity: Not on file  Lifestyle  . Physical activity    Days per week: Not on file    Minutes per session: Not on file  . Stress: Not on file  Relationships  . Social Musicianconnections    Talks on phone: Not on file    Gets together: Not on file    Attends religious service: Not on file    Active member of club or organization: Not on file    Attends meetings of clubs or organizations: Not on file    Relationship status: Not on file   . Intimate partner violence    Fear of current or ex partner: Not on file    Emotionally abused: Not on file    Physically abused: Not on file    Forced sexual activity: Not on file  Other Topics Concern  . Not on file  Social History Narrative   Divorced.    3 kids     Constitutional: Denies fever, malaise, fatigue, headache or abrupt weight changes.  HEENT: Pt reports nasal congestion. Denies eye pain, eye redness, ear pain, ringing in the ears, wax buildup, runny nose, bloody nose, or sore throat. Respiratory: Pt reports cough. Denies difficulty breathing, shortness of breath, cough or sputum production.   Cardiovascular: Denies chest pain, chest tightness, palpitations or swelling in the hands or feet.  Gastrointestinal: Denies abdominal pain, bloating, constipation, diarrhea or blood in the stool.  Musculoskeletal: Pt reports body aches. Denies decrease in range of motion, difficulty with gait, or joint pain and swelling.  Skin: Denies redness, rashes, lesions or ulcercations.   No other specific complaints in a complete review of systems (except as listed in HPI above).  Observations/Objective: Temp 98.7 F (37.1 C) (Oral)  Wt Readings from Last 3 Encounters:  07/19/18 237 lb 7 oz (107.7 kg)  07/02/18 241 lb 7 oz (109.5 kg)  03/12/17 239 lb 8 oz (108.6 kg)    General: Appears her stated age, obese, in NAD. HEENT: Head: normal shape and size; Nose: sounds congested; Throat/Mouth: no hoarseness noted. Pulmonary/Chest: Normal effort. No respiratory distress.  Neurological: Alert and oriented.    BMET    Component Value Date/Time   NA 137 08/15/2018 1014   K 4.0 08/15/2018 1014   CL 106 08/15/2018 1014   CO2 23 08/15/2018 1014   GLUCOSE 90 08/15/2018 1014   BUN 7 08/15/2018 1014   CREATININE 0.67 08/15/2018 1014   CALCIUM 8.6 08/15/2018 1014   GFRNONAA >60 04/03/2015 0214   GFRAA >60 04/03/2015 0214    Lipid Panel     Component Value Date/Time   CHOL 171  08/15/2018 1014   TRIG 174.0 (H) 08/15/2018 1014   HDL 50.00 08/15/2018 1014   CHOLHDL 3 08/15/2018 1014   VLDL 34.8 08/15/2018 1014   LDLCALC 86 08/15/2018 1014    CBC    Component Value Date/Time   WBC 7.0 08/15/2018 1014   RBC 4.50 08/15/2018 1014   HGB 12.4 08/15/2018 1014   HCT 37.9 08/15/2018 1014   PLT 253.0 08/15/2018 1014  MCV 84.2 08/15/2018 1014   MCH 28.6 04/03/2015 0214   MCHC 32.9 08/15/2018 1014   RDW 14.2 08/15/2018 1014   LYMPHSABS 0.9 08/15/2018 1014   MONOABS 0.6 08/15/2018 1014   EOSABS 0.1 08/15/2018 1014   BASOSABS 0.0 08/15/2018 1014    Hgb A1C Lab Results  Component Value Date   HGBA1C 5.2 01/26/2016        Assessment and Plan:  Nasal Congestion, Cough, Fever and Body Aches:  DDx include viral sinusitis, viral URI with cough, COVID 19 She is not interested in getting tested at this time Encourage rest and fluids Continue Zyrtec, Flonase and Mucinex RX for Pred taper x 6 days RX for Hycodan for cough Encouraged self quarantine until symptoms resolve Discussed the importance social distancing, masking and frequent handwashing Work note provided  Er/Return precautions discussed. If no improvement by Friday, consider Augmentin 875-125 mg BID x 190 days  Follow Up Instructions:    I discussed the assessment and treatment plan with the patient. The patient was provided an opportunity to ask questions and all were answered. The patient agreed with the plan and demonstrated an understanding of the instructions.   The patient was advised to call back or seek an in-person evaluation if the symptoms worsen or if the condition fails to improve as anticipated.     Nicki Reaper, NP

## 2019-01-21 NOTE — Patient Instructions (Signed)

## 2019-01-23 ENCOUNTER — Other Ambulatory Visit: Payer: Self-pay

## 2019-01-23 DIAGNOSIS — Z20822 Contact with and (suspected) exposure to covid-19: Secondary | ICD-10-CM

## 2019-01-25 LAB — NOVEL CORONAVIRUS, NAA: SARS-CoV-2, NAA: NOT DETECTED

## 2019-01-29 ENCOUNTER — Encounter: Payer: Self-pay | Admitting: Internal Medicine

## 2019-01-30 MED ORDER — AMOXICILLIN-POT CLAVULANATE 875-125 MG PO TABS: 1.0000 | ORAL_TABLET | Freq: Two times a day (BID) | ORAL | 0 refills | Status: DC

## 2019-02-18 ENCOUNTER — Ambulatory Visit: Payer: BC Managed Care – PPO | Attending: Internal Medicine

## 2019-02-18 DIAGNOSIS — Z20822 Contact with and (suspected) exposure to covid-19: Secondary | ICD-10-CM

## 2019-02-20 LAB — NOVEL CORONAVIRUS, NAA: SARS-CoV-2, NAA: NOT DETECTED

## 2019-03-13 ENCOUNTER — Ambulatory Visit: Payer: BC Managed Care – PPO | Admitting: Family Medicine

## 2019-03-13 ENCOUNTER — Encounter: Payer: Self-pay | Admitting: Family Medicine

## 2019-03-13 ENCOUNTER — Other Ambulatory Visit: Payer: Self-pay

## 2019-03-13 ENCOUNTER — Ambulatory Visit (INDEPENDENT_AMBULATORY_CARE_PROVIDER_SITE_OTHER)
Admission: RE | Admit: 2019-03-13 | Discharge: 2019-03-13 | Disposition: A | Discharge status: Home / Self Care | Payer: BC Managed Care – PPO | Source: Ambulatory Visit | Attending: Family Medicine | Admitting: Family Medicine

## 2019-03-13 VITALS — BP 120/80 | HR 79 | Temp 97.7°F | Ht 65.25 in | Wt 229.5 lb

## 2019-03-13 DIAGNOSIS — M25511 Pain in right shoulder: Secondary | ICD-10-CM

## 2019-03-13 DIAGNOSIS — M5412 Radiculopathy, cervical region: Secondary | ICD-10-CM

## 2019-03-13 DIAGNOSIS — M47812 Spondylosis without myelopathy or radiculopathy, cervical region: Secondary | ICD-10-CM | POA: Diagnosis not present

## 2019-03-13 MED ORDER — NORTRIPTYLINE HCL 25 MG PO CAPS: 25.0000 mg | ORAL_CAPSULE | Freq: Every day | ORAL | 1 refills | Status: DC

## 2019-03-13 MED ORDER — PREDNISONE 20 MG PO TABS: ORAL_TABLET | ORAL | 0 refills | Status: DC

## 2019-03-13 NOTE — Progress Notes (Signed)
[name], MD [facility] and [place] at [facility] [place], [address] Phone: [phone]  FAX: [phone]  Maria Lawson - 49 y.o. female  MRN [id number]  Date of Birth: [birth year]  Visit Date: 03/13/2019  PCP: Tonia Ghent, MD  Referred by: Tonia Ghent, MD  Chief Complaint  Patient presents with  . Shoulder Pain    Right  . Numbness    in fingers    This visit occurred during the SARS-CoV-2 public health emergency.  Safety protocols were in place, including screening questions prior to the visit, additional usage of staff PPE, and extensive cleaning of exam room while observing appropriate contact time as indicated for disinfecting solutions.   Subjective:   Maria Lawson is a 49 y.o. very pleasant female patient with Body mass index is 37.9 kg/m. who presents with the following:  She is a nice lady, patient of Dr. Damita Dunnings and she presents with some ongoing right-sided shoulder pain.  I remember her from multiple visits with her and her family in years past.  This is been an ongoing problem for multiple years and she is having some pain and numbness going down her right arm all the way to the fingers.  Her motion is quite good in her shoulder itself.  She does have pain moving the shoulder.  She often wakes up at night and the symptoms will going down the whole arm and shoulder.  She is not in severe pain, but this has gotten worse and the tingling in her fingers essentially as that nonstop.  She is able to move her hand and arm in certain positions such as on a pillow that seem to make it better.  Daoughter in Sports coach gave some exercises.  She is a physical therapist.  No trauma.   No treartment   Past Medical History, Surgical History, Social History, Family History, Problem List, Medications, and Allergies have been reviewed and updated if relevant.  GEN: no acute illness or fever CV:  No chest pain or shortness of breath MSK: detailed above Neuro: neurological signs are described above ROS O/w per HPI  Objective:   BP 120/80   Pulse 79   Temp 97.7 F (36.5 C) (Temporal)   Ht 5' 5.25" (1.657 m)   Wt 229 lb 8 oz (104.1 kg)   LMP 03/12/2019   SpO2 97%   BMI 37.90 kg/m    GEN: Well-developed,well-nourished,in no acute distress; alert,appropriate and cooperative throughout examination HEENT: Normocephalic and atraumatic without obvious abnormalities. Ears, externally no deformities PULM: Breathing comfortably in no respiratory distress EXT: No clubbing, cyanosis, or edema PSYCH: Normally interactive. Cooperative during the interview. Pleasant. Friendly and conversant. Not anxious or depressed appearing. Normal, full affect.  CERVICAL SPINE EXAM Range of motion: Flexion, extension, lateral bending, and rotation: Full Pain with terminal motion: Modest Spinous Processes: NT SCM: NT Upper paracervical muscles: Modest Upper traps: NT C5-T1 motor function is entirely intact Patient does have some numbness particularly to pinprick on digits 1 through 4.  No difference in the forearm and humerus.  Shoulder: R Inspection: No muscle wasting or winging Ecchymosis/edema: neg  AC joint, scapula, clavicle: NT Abduction: full, 5/5 Flexion: full, 5/5 IR, full, lift-off: 5/5 ER at neutral: full, 5/5 AC crossover and compression: neg Neer: neg Hawkins: neg Drop Test: neg Empty Can: neg Supraspinatus insertion: NT Bicipital groove: NT Speed's: neg Yergason's: neg Sulcus sign: neg Scapular dyskinesis:  none C5-T1 intact Sensation intact Grip 5/5   Radiology: No results found.  Assessment and Plan:     ICD-10-CM   1. Cervical radiculopathy, acute  M54.12 DG Cervical Spine Complete  2. Acute pain of right shoulder  M25.511 DG Cervical Spine Complete   Classic cervical radiculopathy on the right with greater than 1 length of problem.  Pulse with steroids x14  days and also start her on tricyclic antidepressant.  Begin McKenzie protocol.  Patient Instructions  Look up McKenzie protocol for cervical spine on Youtube.      Follow-up: Return in about 1 month (around 04/13/2019).  Meds ordered this encounter  Medications  . predniSONE (DELTASONE) 20 MG tablet    Sig: 2 tabs po for 7 days, then 1 tab po for 7 days    Dispense:  21 tablet    Refill:  0  . nortriptyline (PAMELOR) 25 MG capsule    Sig: Take 1-2 capsules (25-50 mg total) by mouth at bedtime.    Dispense:  60 capsule    Refill:  1   Modified Medications   No medications on file   Orders Placed This Encounter  Procedures  . DG Cervical Spine Complete    Signed,  Steib T. Alyas Creary, MD   Outpatient Encounter Medications as of 03/13/2019  Medication Sig  . cetirizine (ZYRTEC) 10 MG tablet Take 1 tablet (10 mg total) by mouth daily.  . cyanocobalamin (,VITAMIN B-12,) 1000 MCG/ML injection INJECT 1 ML INTO THE MUSCLE EVERY 14 DAYS  . fluticasone (FLONASE) 50 MCG/ACT nasal spray Place 2 sprays into both nostrils daily.  Marland Kitchen guaifenesin (HUMIBID E) 400 MG TABS tablet Take 400 mg by mouth every 6 (six) hours.  . Insulin Syringe-Needle U-100 (B-D INSULIN SYRINGE 1CC/25GX1") 25G X 1" 1 ML MISC Use with B12 injection.  . QUEtiapine (SEROQUEL) 25 MG tablet Take 1 tablet (25 mg total) by mouth at bedtime.  . sertraline (ZOLOFT) 100 MG tablet Take 1.5 tablets (150 mg total) by mouth daily.  . nortriptyline (PAMELOR) 25 MG capsule Take 1-2 capsules (25-50 mg total) by mouth at bedtime.  . predniSONE (DELTASONE) 20 MG tablet 2 tabs po for 7 days, then 1 tab po for 7 days  . [DISCONTINUED] amoxicillin-clavulanate (AUGMENTIN) 875-125 MG tablet Take 1 tablet by mouth 2 (two) times daily.  . [DISCONTINUED] HYDROcodone-homatropine (HYCODAN) 5-1.5 MG/5ML syrup Take 5 mLs by mouth every 8 (eight) hours as needed for cough.  . [DISCONTINUED] predniSONE (DELTASONE) 10 MG tablet Take 6 tabs day 1,  5 tabs day 2, 4 tabs day 3, 3 tabs day 4, 2 tabs day 5, 1 tab day 6   No facility-administered encounter medications on file as of 03/13/2019.

## 2019-03-13 NOTE — Patient Instructions (Signed)
Look up McKenzie protocol for cervical spine on Youtube.

## 2019-03-14 ENCOUNTER — Encounter: Payer: Self-pay | Admitting: Family Medicine

## 2019-03-16 NOTE — Telephone Encounter (Signed)
Lupita Leash, can you help me do this.  I think that she can return to work on Monday with a limited light duty capacity.  Maximum lifting 10 pounds.  Returned to work full length of time.  Remote working would be certainly an appropriate modification.

## 2019-03-17 NOTE — Telephone Encounter (Signed)
Work note written as instructed by Dr. Copland and sent to patient's MyChart. 

## 2019-03-19 DIAGNOSIS — M542 Cervicalgia: Secondary | ICD-10-CM | POA: Diagnosis not present

## 2019-03-19 DIAGNOSIS — M79601 Pain in right arm: Secondary | ICD-10-CM | POA: Diagnosis not present

## 2019-03-25 DIAGNOSIS — M542 Cervicalgia: Secondary | ICD-10-CM | POA: Diagnosis not present

## 2019-03-25 DIAGNOSIS — M79601 Pain in right arm: Secondary | ICD-10-CM | POA: Diagnosis not present

## 2019-03-28 DIAGNOSIS — M79601 Pain in right arm: Secondary | ICD-10-CM | POA: Diagnosis not present

## 2019-03-28 DIAGNOSIS — M542 Cervicalgia: Secondary | ICD-10-CM | POA: Diagnosis not present

## 2019-04-01 DIAGNOSIS — M79601 Pain in right arm: Secondary | ICD-10-CM | POA: Diagnosis not present

## 2019-04-01 DIAGNOSIS — M542 Cervicalgia: Secondary | ICD-10-CM | POA: Diagnosis not present

## 2019-04-02 DIAGNOSIS — F5105 Insomnia due to other mental disorder: Secondary | ICD-10-CM | POA: Diagnosis not present

## 2019-04-02 DIAGNOSIS — F411 Generalized anxiety disorder: Secondary | ICD-10-CM | POA: Diagnosis not present

## 2019-04-02 DIAGNOSIS — F330 Major depressive disorder, recurrent, mild: Secondary | ICD-10-CM | POA: Diagnosis not present

## 2019-04-04 ENCOUNTER — Other Ambulatory Visit: Payer: Self-pay | Admitting: Family Medicine

## 2019-04-04 NOTE — Telephone Encounter (Signed)
Last office visit 03/13/2019 for cervical radiculopathy. Last filled  03/13/2019 for #60 with 1 refill. Pharmacy is requesting 90 day supply.  Please refill for #180  if appropriate.

## 2019-04-08 DIAGNOSIS — M79601 Pain in right arm: Secondary | ICD-10-CM | POA: Diagnosis not present

## 2019-04-08 DIAGNOSIS — M542 Cervicalgia: Secondary | ICD-10-CM | POA: Diagnosis not present

## 2019-04-18 DIAGNOSIS — M79601 Pain in right arm: Secondary | ICD-10-CM | POA: Diagnosis not present

## 2019-04-18 DIAGNOSIS — M542 Cervicalgia: Secondary | ICD-10-CM | POA: Diagnosis not present

## 2019-04-22 DIAGNOSIS — M79601 Pain in right arm: Secondary | ICD-10-CM | POA: Diagnosis not present

## 2019-04-22 DIAGNOSIS — M542 Cervicalgia: Secondary | ICD-10-CM | POA: Diagnosis not present

## 2019-12-24 ENCOUNTER — Encounter: Payer: Self-pay | Admitting: Family Medicine

## 2019-12-28 MED ORDER — QUETIAPINE FUMARATE 25 MG PO TABS: 12.5000 mg | ORAL_TABLET | Freq: Every evening | ORAL | 0 refills | Status: DC | PRN

## 2019-12-28 MED ORDER — SERTRALINE HCL 100 MG PO TABS: 100.0000 mg | ORAL_TABLET | Freq: Every day | ORAL | 0 refills | Status: DC

## 2019-12-29 ENCOUNTER — Other Ambulatory Visit: Payer: Self-pay | Admitting: Family Medicine

## 2019-12-29 NOTE — Telephone Encounter (Signed)
Pharmacy requests refill on: Sertraline HCL 100 mg  LAST REFILL: 12/24/2019 LAST OV: 03/13/2019 NEXT OV: Not Scheduled PHARMACY: [id number] [place], [place]  Pharmacy requests refill on: Quetiapine Fumarate 25 mg  LAST REFILL: 12/24/2019 LAST OV: 03/13/2019 NEXT OV: Not Scheduled PHARMACY: [id number] [place], [place]  Denied D/T last refill on 12/24/2019

## 2019-12-31 ENCOUNTER — Telehealth: Payer: Self-pay | Admitting: Family Medicine

## 2019-12-31 NOTE — Telephone Encounter (Signed)
Please check with pharmacy listed below about Seroquel and sertraline refills.  I thought these had gone through and according to the EMR, they were previously sent.  The patient reported the pharmacy did not have the prescriptions on file.  [id number] [facility] [address]  [address], [place] [address]  Phone:  [phone] Fax:  [phone]   The patient may be out of state in the meantime and I asked her to let us know what pharmacy was nearby so we can send the refills there if needed.  Please see my chart message.  Thanks.

## 2020-01-02 MED ORDER — QUETIAPINE FUMARATE 25 MG PO TABS
12.5000 mg | ORAL_TABLET | Freq: Every evening | ORAL | 0 refills | Status: DC | PRN
Start: 2020-01-02 — End: 2020-04-07

## 2020-01-02 MED ORDER — SERTRALINE HCL 100 MG PO TABS
100.0000 mg | ORAL_TABLET | Freq: Every day | ORAL | 0 refills | Status: DC
Start: 2020-01-02 — End: 2020-04-07

## 2020-01-02 NOTE — Telephone Encounter (Signed)
Pt left v/m that refills for sertraline and seroquel are still not at pharmacy. Pt has been out of med; Pt request cb today. I spoke with [facility]  At [facility] and pharmacy did not receive electronic submission on 12/28/19; not know why. I gave verbal order to refill sertraline 100 mg # 90 x 0 and Quetiapine 25 mg # 90 x 0. Jose said would be ready for pick up in 30'. Per DPR left detailed v/m for pt that med would be ready for pick up at [facility] around 11:15 AM today and left v/m if needs to speak with  [place] to call [phone] option 4. FYI to Dr Para March.

## 2020-01-02 NOTE — Telephone Encounter (Signed)
Noted. Thanks.

## 2020-01-02 NOTE — Addendum Note (Signed)
Addended by: Patience Musca on: 01/02/2020 10:48 AM   Modules accepted: Orders

## 2020-01-14 DIAGNOSIS — Z20822 Contact with and (suspected) exposure to covid-19: Secondary | ICD-10-CM | POA: Diagnosis not present

## 2020-01-14 DIAGNOSIS — J0110 Acute frontal sinusitis, unspecified: Secondary | ICD-10-CM | POA: Diagnosis not present

## 2020-01-19 ENCOUNTER — Encounter: Payer: Self-pay | Admitting: Primary Care

## 2020-01-19 ENCOUNTER — Ambulatory Visit
Admission: RE | Admit: 2020-01-19 | Discharge: 2020-01-19 | Disposition: A | Discharge status: Home / Self Care | Payer: BC Managed Care – PPO | Attending: Primary Care | Admitting: Primary Care

## 2020-01-19 ENCOUNTER — Telehealth (INDEPENDENT_AMBULATORY_CARE_PROVIDER_SITE_OTHER): Payer: BC Managed Care – PPO | Admitting: Primary Care

## 2020-01-19 ENCOUNTER — Other Ambulatory Visit: Payer: Self-pay

## 2020-01-19 ENCOUNTER — Ambulatory Visit
Admission: RE | Admit: 2020-01-19 | Discharge: 2020-01-19 | Disposition: A | Discharge status: Home / Self Care | Payer: BC Managed Care – PPO | Source: Ambulatory Visit | Attending: Primary Care | Admitting: Primary Care

## 2020-01-19 VITALS — Ht 65.2 in | Wt 229.0 lb

## 2020-01-19 DIAGNOSIS — R059 Cough, unspecified: Secondary | ICD-10-CM | POA: Diagnosis not present

## 2020-01-19 DIAGNOSIS — R0602 Shortness of breath: Secondary | ICD-10-CM

## 2020-01-19 DIAGNOSIS — R0789 Other chest pain: Secondary | ICD-10-CM | POA: Diagnosis not present

## 2020-01-19 MED ORDER — ALBUTEROL SULFATE HFA 108 (90 BASE) MCG/ACT IN AERS: 2.0000 | INHALATION_SPRAY | RESPIRATORY_TRACT | 0 refills | Status: DC | PRN

## 2020-01-19 NOTE — Progress Notes (Signed)
Subjective:    Patient ID: Maria Lawson, female    DOB: Aug 07, 1970, 49 y.o.   MRN: [id number]  HPI  Virtual Visit via Video Note  I connected with Maria Lawson on 01/19/20 at  2:00 PM EST by a video enabled telemedicine application and verified that I am speaking with the correct person using two identifiers.  Location: Patient: Work Provider: Office Participants: patient and myself   I discussed the limitations of evaluation and management by telemedicine and the availability of in person appointments. The patient expressed understanding and agreed to proceed.  History of Present Illness:  Ms. Stoneberg is a 49 year old female patient of Dr. Lianne Bushy with a history of GERD, viral sore throat, anemia who presents today with a chief complaint of chest tightness.  She also reports exertional shortness of breath, cough. She feels as though she can't get any mucous or congestion up due to the tightness. She denies a history of asthma, but has had bronchitis in the past that required use of albuterol inhaler. She is a smoker of cigarettes.   She was evaluated at Urgent Care on December 1st, tested negative for Covid-19, prescribed Augmentin and told to use Afrin for three days. Since then she's not noticed much improvement. Symptoms originally began 2 weeks prior.   She's been compliant to her antibiotic as prescribed, also taking Mucinex, prednisone 40 mg (started yesterday and this morning from an older prescription), son's albuterol inhaler. She's noticed improvement to the pressure in her head from prednisone, and improvement in chest tightness and shortness of breath with albuterol.    Observations/Objective:  Alert and oriented. Appears well, not sickly. No distress. Speaking in complete sentences. No cough during visit.  Assessment and Plan:  Acute URI symptoms that may be more lower respiratory now. Denies history of asthma, but she is a smoker. Continue  Augmentin. Add prednisone 40 mg daily x 5 days. She has enough from a prior prescription. Rx for albuterol sent to pharmacy.  Will obtain chest xray today. Lower likelihood of PE but need to keep in differentials.  She will update later this week, if no improvement with prednisone then will proceed with work up for other causes.   Follow Up Instructions:  Continue Augmentin as prescribed.  Continue prednisone 40 mg daily for a total of five days.  Shortness of Breath/Wheezing/Cough: Use the albuterol inhaler. Inhale 2 puffs into the lungs every 4 to 6 hours as needed for wheezing, cough, and/or shortness of breath.   Please proceed to [facility] outpatient imaging for your chest xray. I'll be in touch with results.  It was a pleasure meeting you! Mayra Reel, NP-C    I discussed the assessment and treatment plan with the patient. The patient was provided an opportunity to ask questions and all were answered. The patient agreed with the plan and demonstrated an understanding of the instructions.   The patient was advised to call back or seek an in-person evaluation if the symptoms worsen or if the condition fails to improve as anticipated.    Doreene Nest, NP    Review of Systems  Constitutional: Negative for fever.  HENT: Positive for congestion and sinus pressure. Negative for sore throat.   Respiratory: Positive for cough, chest tightness and shortness of breath.   Allergic/Immunologic: Positive for environmental allergies.       Past Medical History:  Diagnosis Date  . Allergy 10/12/1998  . Anxiety 12/14/2004  . Migraine with aura   .  Panic attack      Social History   Socioeconomic History  . Marital status: Divorced    Spouse name: Not on file  . Number of children: 3  . Years of education: Not on file  . Highest education level: Not on file  Occupational History  . Occupation: Former Emergency planning/management officer: UNEMPLOYED  Tobacco Use  . Smoking  status: Current Every Day Smoker    Packs/day: 0.00    Years: 0.00    Pack years: 0.00    Types: E-cigarettes  . Smokeless tobacco: Never Used  . Tobacco comment: age 19 quit  Substance and Sexual Activity  . Alcohol use: Yes    Alcohol/week: 0.0 standard drinks    Comment: rare  . Drug use: No  . Sexual activity: Not on file  Other Topics Concern  . Not on file  Social History Narrative   Divorced.    3 kids   Social Determinants of Health   Financial Resource Strain:   . Difficulty of Paying Living Expenses: Not on file  Food Insecurity:   . Worried About Programme researcher, broadcasting/film/video in the Last Year: Not on file  . Ran Out of Food in the Last Year: Not on file  Transportation Needs:   . Lack of Transportation (Medical): Not on file  . Lack of Transportation (Non-Medical): Not on file  Physical Activity:   . Days of Exercise per Week: Not on file  . Minutes of Exercise per Session: Not on file  Stress:   . Feeling of Stress : Not on file  Social Connections:   . Frequency of Communication with Friends and Family: Not on file  . Frequency of Social Gatherings with Friends and Family: Not on file  . Attends Religious Services: Not on file  . Active Member of Clubs or Organizations: Not on file  . Attends Banker Meetings: Not on file  . Marital Status: Not on file  Intimate Partner Violence:   . Fear of Current or Ex-Partner: Not on file  . Emotionally Abused: Not on file  . Physically Abused: Not on file  . Sexually Abused: Not on file    Past Surgical History:  Procedure Laterality Date  . APPENDECTOMY  1987  . VAGINAL DELIVERY      Family History  Problem Relation Age of Onset  . Hyperlipidemia Mother   . Breast cancer Mother   . Hypertension Father   . Hyperlipidemia Father   . Memory loss Father   . Colon cancer Maternal Uncle     Allergies  Allergen Reactions  . Latex     REACTION: rash    Current Outpatient Medications on File Prior to  Visit  Medication Sig Dispense Refill  . cetirizine (ZYRTEC) 10 MG tablet Take 1 tablet (10 mg total) by mouth daily.    . fluticasone (FLONASE) 50 MCG/ACT nasal spray Place 2 sprays into both nostrils daily.    Marland Kitchen guaifenesin (HUMIBID E) 400 MG TABS tablet Take 400 mg by mouth every 6 (six) hours.    . Insulin Syringe-Needle U-100 (B-D INSULIN SYRINGE 1CC/25GX1") 25G X 1" 1 ML MISC Use with B12 injection. 10 each 0  . QUEtiapine (SEROQUEL) 25 MG tablet Take 0.5-1 tablets (12.5-25 mg total) by mouth at bedtime as needed (for insomnia). 90 tablet 0  . sertraline (ZOLOFT) 100 MG tablet Take 1 tablet (100 mg total) by mouth daily. 90 tablet 0  . amoxicillin-clavulanate (AUGMENTIN) 875-125  MG tablet Take 1 tablet by mouth 2 (two) times daily.    . cyanocobalamin (,VITAMIN B-12,) 1000 MCG/ML injection INJECT 1 ML INTO THE MUSCLE EVERY 14 DAYS (Patient not taking: Reported on 01/19/2020) 6 mL 1   No current facility-administered medications on file prior to visit.    Ht 5' 5.2" (1.656 m)   Wt 229 lb (103.9 kg)   BMI 37.87 kg/m    Objective:   Physical Exam Constitutional:      General: She is not in acute distress.    Appearance: She is not ill-appearing.  Pulmonary:     Effort: Pulmonary effort is normal.     Comments: No cough during visit  Neurological:     Mental Status: She is alert and oriented to person, place, and time.  Psychiatric:        Mood and Affect: Mood normal.            Assessment & Plan:

## 2020-01-19 NOTE — Patient Instructions (Signed)
Continue Augmentin as prescribed.  Continue prednisone 40 mg daily for a total of five days.  Shortness of Breath/Wheezing/Cough: Use the albuterol inhaler. Inhale 2 puffs into the lungs every 4 to 6 hours as needed for wheezing, cough, and/or shortness of breath.   Please proceed to [facility] outpatient imaging for your chest xray. I'll be in touch with results.  It was a pleasure meeting you! Mayra Reel, NP-C

## 2020-01-19 NOTE — Assessment & Plan Note (Signed)
Acute URI symptoms that may be more lower respiratory now. Denies history of asthma, but she is a smoker. Continue Augmentin. Add prednisone 40 mg daily x 5 days. She has enough from a prior prescription. Rx for albuterol sent to pharmacy.  Will obtain chest xray today. Lower likelihood of PE but need to keep in differentials.  She will update later this week, if no improvement with prednisone then will proceed with work up for other causes.

## 2020-01-20 ENCOUNTER — Other Ambulatory Visit: Payer: Self-pay | Admitting: Primary Care

## 2020-01-20 DIAGNOSIS — J22 Unspecified acute lower respiratory infection: Secondary | ICD-10-CM

## 2020-01-20 MED ORDER — AZITHROMYCIN 250 MG PO TABS: ORAL_TABLET | ORAL | 0 refills | Status: DC

## 2020-01-26 ENCOUNTER — Telehealth: Payer: Self-pay | Admitting: *Deleted

## 2020-01-26 NOTE — Telephone Encounter (Signed)
Patient left a voicemail stating that she did a virtual visit with Mayra Reel NP last week and then talked with her on the phone Thursday. Patient stated that she was advised to call back today to give an update. Patient stated that she feels some better, but still feels bad. Patient stated  that she has a lot of congestion. Patient stated that she has a lot of head pain and head pressure. Patient stated that she has completed the medication and still has some SOB. [facility]

## 2020-01-26 NOTE — Telephone Encounter (Signed)
Noted, please thank patient for the call and update. At this point I recommend she speak with her PCP given ongoing symptoms despite treatment.  Alice, will you schedule her with PCP?

## 2020-01-27 NOTE — Telephone Encounter (Signed)
Sent msg to secure MAB infusion chat who report they have added her to the list and will contact her in 24-48 hours.  Left detailed msg for pt.

## 2020-01-27 NOTE — Telephone Encounter (Signed)
I spoke with pt; pt tested + covid on 01/26/20 and testing was home test x 2 and both were positive. Pt said on 01/26/20 pt had severe H/A but today the H/A is still there but minimal. Pt has head pressure and feels tightness in sinus. No dizziness.  Pt said some SOB upon exertion but has quick recovery when sits down; today pulse ox is 96%;pt has inhaler if needed. Pt had CXR on 01/19/20. Pt had temp 99.9 on 01/26/20 and has been taking Tylenol 1000 mg q 6 h three times a day since yesterday. Today Temp is 98. Pt has chills on and off and is very tired. Prod cough with yellow phlegm; has head and chest congestion but no wheezing; pt is using a humidifier. Pt has had slight diarrhea that is not watery. Pt has runny nose, slight S/T and her tongue has a red like rash on it. Pt does not have taste or smell. Pt said she is not in any distress with her breathing now. Pt already has virtual appt scheduled 01/29/20 at 9 AM with Dr Para March. UC & ED precautions given and pt voiced understanding. Sending note to Dr Para March as requested. Will send note to Allayne Gitelman NP as well.

## 2020-01-27 NOTE — Telephone Encounter (Signed)
Noted. I did recommend patient re-test during our visit, but she insisted that she didn't have Covid-19 and did not want to retest.

## 2020-01-27 NOTE — Telephone Encounter (Signed)
When did she test positive?  Please triage patient and then let me know.  I am routing this to [facility] in the meantime re: covid positive for consideration of antibody infusion.  Thanks.

## 2020-01-27 NOTE — Telephone Encounter (Signed)
Called patient and scheduled virtual visit with PCP on 12/16, as patient recently tested positive for COVID-19. Sending to PCP as FYI.

## 2020-01-27 NOTE — Telephone Encounter (Signed)
Noted. Please refer to infusion clinic for consideration of treatment.  Thanks.

## 2020-01-28 ENCOUNTER — Telehealth (HOSPITAL_COMMUNITY): Payer: Self-pay

## 2020-01-28 NOTE — Telephone Encounter (Signed)
Called to Discuss with patient about Covid symptoms and the use of the monoclonal antibody infusion for those with mild to moderate Covid symptoms and at a high risk of hospitalization.     Pt appears to qualify for this infusion due to co-morbid conditions and/or a member of an at-risk group in accordance with the FDA Emergency Use Authorization.    Pt stated her symptoms started on 12/13 and she tested positive on 12/14. Pt states her symptoms are that of a mild cold. Pt stated she has a virtual appointment with her PCP in the am and will discuss treatment with him. If interested she will call us back. COVID hotline number provided [phone].

## 2020-01-28 NOTE — Telephone Encounter (Signed)
Thanks

## 2020-01-29 ENCOUNTER — Other Ambulatory Visit: Payer: Self-pay

## 2020-01-29 ENCOUNTER — Telehealth (INDEPENDENT_AMBULATORY_CARE_PROVIDER_SITE_OTHER): Payer: BC Managed Care – PPO | Admitting: Family Medicine

## 2020-01-29 DIAGNOSIS — U071 COVID-19: Secondary | ICD-10-CM | POA: Diagnosis not present

## 2020-01-29 NOTE — Assessment & Plan Note (Signed)
Covid.  NAD.  Okay for outpatient f/u.  She talked with the antibody infusion clinic.  She does qualify and is considering options.  She knows to call back and she can get seen same day or the next day for infusion if needed.  She is clearly in the 10 day window.  It is likely reasonable to monitor her sx and if any worsening/if any need for SABA then she'll call the infusion clinic to get scheduled.  Supportive care in the meantime.  She agrees with plan.  She will quarantine per protocol.

## 2020-01-29 NOTE — Progress Notes (Signed)
Interactive audio and video telecommunications were attempted between this provider and patient, however failed, due to patient having technical difficulties OR patient did not have access to video capability.  We continued and completed visit with audio only.   Virtual Visit via Telephone Note  I connected with patient on 01/29/20  at 9:14 AM  by telephone and verified that I am speaking with the correct person using two identifiers.  Location of patient: home  Location of MD: [facility] Name of referring provider (if blank then none associated): Names per persons and role in encounter:  MD: Ferd Hibbs, Patient: name listed above.    I discussed the limitations, risks, security and privacy concerns of performing an evaluation and management service by telephone and the availability of in person appointments. I also discussed with the patient that there may be a patient responsible charge related to this service. The patient expressed understanding and agreed to proceed.  CC: covid  History of Present Illness: sx started about 2 weeks ago.  Seen at [facility].  Started on abx at that point.  She had neg covid test at that point.  Xray done 01/19/20, with hazy bibasilar opacities, atelectasis versus atypical infection.  Started on zithromax additionally and clearly felt better soon thereafter.    Done with augmentin, azithro and prednisone.  Hasn't needed to use SABA.    She tested positive for covid after boyfriend tested positive for covid.  He lost taste and smell but he is not ill o/w.  She tested positive 01/26/20 and that was likely her 1st day of sx with this episode.  She isn't SOB.  She can still taste and smell.  Pulse ox is still 95-98% RA.  She has head congestion.  No sputum.  Minimal cough.  Clearing throat but not coughing up sputum o/w.  No fevers now, not since 13th.  No diarrhea, no rash.  Some mild stomach discomfort but not diarrhea or vomiting.  She is cautiously optimistic.     She doesn't think she needs a work note.    She talked with the antibody infusion clinic.  She does qualify and is considering options.  She knows to call back and she can get seen same day or the next day.  She is clearly in the 10 day window.    She will quarantine per protocol.     Observations/Objective: nad Speech wnl.    Assessment and Plan: Covid.  NAD.  Okay for outpatient f/u.  She talked with the antibody infusion clinic.  She does qualify and is considering options.  She knows to call back and she can get seen same day or the next day for infusion if needed.  She is clearly in the 10 day window.  It is likely reasonable to monitor her sx and if any worsening/if any need for SABA then she'll call the infusion clinic to get scheduled.  Supportive care in the meantime.  She agrees with plan.  She will quarantine per protocol.    Follow Up Instructions: see above.     I discussed the assessment and treatment plan with the patient. The patient was provided an opportunity to ask questions and all were answered. The patient agreed with the plan and demonstrated an understanding of the instructions.   The patient was advised to call back or seek an in-person evaluation if the symptoms worsen or if the condition fails to improve as anticipated.  I provided 16 minutes of non-face-to-face  time during this encounter.  Crawford Givens, MD

## 2020-02-08 ENCOUNTER — Encounter: Payer: Self-pay | Admitting: Family Medicine

## 2020-02-11 ENCOUNTER — Encounter: Payer: Self-pay | Admitting: Family Medicine

## 2020-02-11 ENCOUNTER — Telehealth (INDEPENDENT_AMBULATORY_CARE_PROVIDER_SITE_OTHER): Payer: BC Managed Care – PPO | Admitting: Family Medicine

## 2020-02-11 ENCOUNTER — Other Ambulatory Visit: Payer: Self-pay

## 2020-02-11 VITALS — Ht 66.0 in

## 2020-02-11 DIAGNOSIS — U099 Post covid-19 condition, unspecified: Secondary | ICD-10-CM

## 2020-02-11 DIAGNOSIS — J0100 Acute maxillary sinusitis, unspecified: Secondary | ICD-10-CM

## 2020-02-11 MED ORDER — PREDNISONE 20 MG PO TABS: 40.0000 mg | ORAL_TABLET | Freq: Every day | ORAL | 0 refills | Status: AC

## 2020-02-11 MED ORDER — AMOXICILLIN-POT CLAVULANATE 875-125 MG PO TABS: 1.0000 | ORAL_TABLET | Freq: Two times a day (BID) | ORAL | 0 refills | Status: AC

## 2020-02-11 NOTE — Progress Notes (Signed)
Bartolo T. Nadalyn Deringer, MD Primary Care and Sports Medicine [facility] at [facility] [address] [place], [address] Phone: [phone]  FAX: [phone]  Maria Lawson - 49 y.o. female  MRN [id number]  Date of Birth: 02-Nov-1970  Visit Date: 02/11/2020  PCP: Joaquim Nam, MD  Referred by: Joaquim Nam, MD  Virtual Visit via Video Note:  I connected with  Inocente Salles on 02/11/2020  3:40 PM EST by a video enabled telemedicine application and verified that I am speaking with the correct person using two identifiers.   Location patient: home computer, tablet, or smartphone Location provider: work or home office Consent: Verbal consent directly obtained from [employer]. Persons participating in the virtual visit: patient, provider  I discussed the limitations of evaluation and management by telemedicine and the availability of in person appointments. The patient expressed understanding and agreed to proceed.  Chief Complaint  Patient presents with  . Sinusitis  . Headache    Tested Positive on 01/26/2020  . Ear Congestion    History of Present Illness:  Had Covid - has some sinus infection, and then she had some Covid with some bilateral changes to her lungs.  Then she tested positive for Covid 01/26/2020.  No taste and smell, some fatigue.  Bad congestion.  Headache, stuff in her ears.  ? Not sure if she got better.   Feels a lot of congestion and pain.   Has tried Afrin, mucinex,  Breathing steam will be helpful.  At this point, she has now having some severe pain in her frontal face.  She has extensive mucus discharge.  She has tried a variety of over-the-counter medicines without any significant relief.  She also does smoke.  Review of Systems as above: See pertinent positives and pertinent negatives per HPI No acute distress verbally   Observations/Objective/Exam:  An attempt was made to discern vital signs over  the phone and per patient if applicable and possible.   General:    Alert, Oriented, appears well and in no acute distress  Pulmonary:     On inspection no signs of respiratory distress.  Psych / Neurological:     Pleasant and cooperative.  Assessment and Plan:    ICD-10-CM   1. Acute non-recurrent maxillary sinusitis  J01.00   2. Post covid-19 condition, unspecified  U09.9    This is difficult without an exam, but she is having a lot of frontal facial pain.  She also does have some allergies and she is a smoker.  Hopefully if I give her some steroids this will open her up and help with her breathing as well as her nasal passageways.  I sent in some antibiotic, but I told her to give it 2 or 3 days to see if she starts feeling better.  I discussed the assessment and treatment plan with the patient. The patient was provided an opportunity to ask questions and all were answered. The patient agreed with the plan and demonstrated an understanding of the instructions.   The patient was advised to call back or seek an in-person evaluation if the symptoms worsen or if the condition fails to improve as anticipated.  Follow-up: prn unless noted otherwise below No follow-ups on file.  Meds ordered this encounter  Medications  . predniSONE (DELTASONE) 20 MG tablet    Sig: Take 2 tablets (40 mg total) by mouth daily for 7 days.    Dispense:  14 tablet    Refill:  0  . amoxicillin-clavulanate (AUGMENTIN) 875-125 MG tablet    Sig: Take 1 tablet by mouth 2 (two) times daily for 7 days.    Dispense:  14 tablet    Refill:  0   No orders of the defined types were placed in this encounter.   Signed,  Elpidio Galea. Cartez Mogle, MD

## 2020-02-27 DIAGNOSIS — K1120 Sialoadenitis, unspecified: Secondary | ICD-10-CM | POA: Diagnosis not present

## 2020-02-27 DIAGNOSIS — Z20822 Contact with and (suspected) exposure to covid-19: Secondary | ICD-10-CM | POA: Diagnosis not present

## 2020-04-05 ENCOUNTER — Other Ambulatory Visit: Payer: Self-pay | Admitting: Family Medicine

## 2020-04-06 ENCOUNTER — Other Ambulatory Visit: Payer: Self-pay | Admitting: Family Medicine

## 2020-04-06 ENCOUNTER — Telehealth: Payer: Self-pay

## 2020-04-06 NOTE — Telephone Encounter (Signed)
Pharmacy requests refill on: Sertraline 100 mg   LAST REFILL: 01/02/2020 (Q-90, R-0) LAST OV: 07/19/2018 (Last Physical)  NEXT OV: Not Scheduled  PHARMACY: [id number] [place], [place]

## 2020-04-06 NOTE — Telephone Encounter (Signed)
Patient called and was complaining of RUQ pain that has been ongoing since 2018 and rates it as a 3/10 pain that she describes as a pinching/discomfort pain. Patient stated that she saw Dr. Para March for this a while ago and was sent for a U/S, which showed a stone. Patient didn't want to move further with surgery. Patient denies SOB, chest pain, N/V. Patient reports diarrhea, but states that this is chronic. Patient wanted to make an appointment with Dr. Para March to f/u with these issues and get his opinion. Patient scheduled for 04/13/2020 at 11:30 with Dr. Para March. Informed patient that if pain gets worse or she develops new symptoms to report to ED. Patient verbalized understanding.

## 2020-04-06 NOTE — Telephone Encounter (Signed)
Pharmacy requests refill on: Quetiapine 25 mg    LAST REFILL: 01/02/2020 (Q-90, R-0) LAST OV: 07/19/2018 (Last Physical)  NEXT OV: Not Scheduled  PHARMACY: [id number] [place], [place]

## 2020-04-07 NOTE — Telephone Encounter (Signed)
lmtcb

## 2020-04-07 NOTE — Telephone Encounter (Addendum)
Sent. Thanks.  Has CPE scheduled.

## 2020-04-07 NOTE — Telephone Encounter (Signed)
Called pt and scheduled CPE for 3/21/at 12 pm and needs same day labs

## 2020-04-07 NOTE — Telephone Encounter (Signed)
Make sure she avoids fatty foods in the meantime.  Fatty foods could theoretically make her symptoms worse.  I will see her at office visit as scheduled.  Agree with ER cautions in the meantime.  Thanks.

## 2020-04-07 NOTE — Telephone Encounter (Signed)
Spoke with patient about below message. She is going to avoid fatty foods as much as possible and will see her 04/13/20. Advised patient if she gets worse before her appt to let us know.

## 2020-04-07 NOTE — Telephone Encounter (Signed)
Sent. Thanks.   

## 2020-04-13 ENCOUNTER — Encounter: Payer: Self-pay | Admitting: Family Medicine

## 2020-04-13 ENCOUNTER — Telehealth (INDEPENDENT_AMBULATORY_CARE_PROVIDER_SITE_OTHER): Payer: BC Managed Care – PPO | Admitting: Family Medicine

## 2020-04-13 DIAGNOSIS — R1011 Right upper quadrant pain: Secondary | ICD-10-CM

## 2020-04-13 NOTE — Progress Notes (Signed)
Patient is being seen today for RUQ pain that has been going on off and on for a couple years. Patient does not take anything for the pain.

## 2020-04-13 NOTE — Progress Notes (Signed)
Virtual visit completed through WebEx or similar program Patient location: home  Provider location: [place] at [facility], office  Participants: Patient and me (unless stated otherwise below)  Pandemic considerations d/w pt.   Limitations and rationale for visit method d/w patient.  Patient agreed to proceed.   CC: abd sx.   HPI:  Her son had covid exposure, d/w pt.  He had mild sx at this point which could be from seasonal allergies.  Cautions given to patient.    She doesn't have good sense of smell from infection but taste returned.  She doesn't have new sx.    Taking seroquel not every night.  Usually taking 1/2 tab about 5 nights a week.  No ADE on med, it helps.  Still on sertraline at baseline.    abd sx.  H/o stone on u/s prev.  She had seen surgery years ago.  She made sig diet changes and was doing well until recently.  Now with alternating constipation vs loose stools, yellowish.  Episodic discomfort in R upper abd and some episodic shoulder discomfort.  Shoulder pain is after eating.  occ RUQ pain with eating.  She doesn't have nocturnal sx.  No vomiting.  Some occ nausea and heartburn.  No blood in stool.  No lower or L sided abd discomfort.    Meds and allergies reviewed.   ROS: Per HPI unless specifically indicated in ROS section   NAD Speech wnl No pain with int/ext rotation R shoulder, no impingement on testing   A/P: Abdominal pain.  Needs repeat gallbladder evaluation. Needs labs and abdominal u/s set up, then needs referral to gen surgery Dr. Lemar Livings.  We will get all the order set and go from there.  Routine cautions given to patient.  Would avoid fatty foods in the meantime.  She agrees to plan.

## 2020-04-14 DIAGNOSIS — R109 Unspecified abdominal pain: Secondary | ICD-10-CM | POA: Insufficient documentation

## 2020-04-14 NOTE — Assessment & Plan Note (Signed)
Abdominal pain.  Needs repeat gallbladder evaluation. Needs labs and abdominal u/s set up, then needs referral to gen surgery Dr. Lemar Livings.  We will get all the order set and go from there.  Routine cautions given to patient.  Would avoid fatty foods in the meantime.  She agrees to plan.

## 2020-04-14 NOTE — Addendum Note (Signed)
Addended by: Joaquim Nam on: 04/14/2020 10:25 PM   Modules accepted: Orders

## 2020-04-15 ENCOUNTER — Telehealth: Payer: Self-pay

## 2020-04-15 NOTE — Telephone Encounter (Signed)
Left message asking patient to call me back to go over her availability for [place], also to verify if she has any metal in her body or implanted devices? [place] location ok? Any Covid symptoms or been positive for COVID in the last 2 months?

## 2020-04-19 ENCOUNTER — Other Ambulatory Visit: Payer: Self-pay

## 2020-04-19 ENCOUNTER — Other Ambulatory Visit (INDEPENDENT_AMBULATORY_CARE_PROVIDER_SITE_OTHER): Payer: BC Managed Care – PPO

## 2020-04-19 DIAGNOSIS — R1011 Right upper quadrant pain: Secondary | ICD-10-CM | POA: Diagnosis not present

## 2020-04-19 LAB — COMPREHENSIVE METABOLIC PANEL
ALT: 10 U/L (ref 0–35)
AST: 11 U/L (ref 0–37)
Albumin: 4.3 g/dL (ref 3.5–5.2)
Alkaline Phosphatase: 59 U/L (ref 39–117)
BUN: 11 mg/dL (ref 6–23)
CO2: 26 mEq/L (ref 19–32)
Calcium: 9.3 mg/dL (ref 8.4–10.5)
Chloride: 104 mEq/L (ref 96–112)
Creatinine, Ser: 0.77 mg/dL (ref 0.40–1.20)
GFR: 91.53 mL/min (ref >60.00)
Glucose, Bld: 92 mg/dL (ref 70–99)
Potassium: 4 mEq/L (ref 3.5–5.1)
Sodium: 139 mEq/L (ref 135–145)
Total Bilirubin: 0.3 mg/dL (ref 0.2–1.2)
Total Protein: 6.9 g/dL (ref 6.0–8.3)

## 2020-04-19 LAB — CBC WITH DIFFERENTIAL/PLATELET
Basophils Absolute: 0 10*3/uL (ref 0.0–0.1)
Basophils Relative: 0.4 % (ref 0.0–3.0)
Eosinophils Absolute: 0.2 10*3/uL (ref 0.0–0.7)
Eosinophils Relative: 2.2 % (ref 0.0–5.0)
HCT: 41 % (ref 36.0–46.0)
Hemoglobin: 13.7 g/dL (ref 12.0–15.0)
Lymphocytes Relative: 19.3 % (ref 12.0–46.0)
Lymphs Abs: 1.7 10*3/uL (ref 0.7–4.0)
MCHC: 33.6 g/dL (ref 30.0–36.0)
MCV: 85.8 fl (ref 78.0–100.0)
Monocytes Absolute: 0.7 10*3/uL (ref 0.1–1.0)
Monocytes Relative: 8.1 % (ref 3.0–12.0)
Neutro Abs: 6.1 10*3/uL (ref 1.4–7.7)
Neutrophils Relative %: 70 % (ref 43.0–77.0)
Platelets: 251 10*3/uL (ref 150.0–400.0)
RBC: 4.78 Mil/uL (ref 3.87–5.11)
RDW: 13.8 % (ref 11.5–15.5)
WBC: 8.8 10*3/uL (ref 4.0–10.5)

## 2020-04-19 LAB — LIPASE: Lipase: 205 U/L — ABNORMAL HIGH (ref 11.0–59.0)

## 2020-04-19 NOTE — Telephone Encounter (Signed)
Spoke with patient and scheduled [place] for 04/27/20

## 2020-04-21 ENCOUNTER — Telehealth: Payer: Self-pay

## 2020-04-21 NOTE — Telephone Encounter (Signed)
Reviewed labs - kidneys, liver, blood counts overall ok. Lipase mildly elevated which could be sign of pancreas inflammation or gallstone trouble. Glad she has upcoming abd [place]. When is she seeing gen surgery? Recommend bland diet, avoid fat/greasy foods, push small sips of fluid throughout the day to stay hydrated. If worsening abd pain, vomiting, fever to let us know as may need to seek urgent eval.

## 2020-04-21 NOTE — Telephone Encounter (Signed)
Pt said that she is uncomfortable in rt upper abd and rt shoulder pain; the pain is same as when had recent appt.  pt is watching her diet. [place] of abd RUQ scheduled for 04/27/20. Pt looked at lab results and request provider to look at lab results and let her know if there was any concerns. Lipase elevated at 205. [facility] [address]. Sending note to DR Para March who is out of office and Dr G who is in office.

## 2020-04-23 NOTE — Telephone Encounter (Signed)
Spoke with patient about below message. Patient will be having her US done on 04/27/20 and then will see general surgery on 04/28/20. Advised to seek urgent eval if sx worsen and let us know if she needs anything before her appt.

## 2020-04-25 NOTE — Telephone Encounter (Signed)
I agree with all that.  I will await the ultrasound and the follow-up surgery note.  Thanks.

## 2020-04-27 ENCOUNTER — Other Ambulatory Visit: Payer: Self-pay

## 2020-04-27 ENCOUNTER — Ambulatory Visit
Admission: RE | Admit: 2020-04-27 | Discharge: 2020-04-27 | Disposition: A | Discharge status: Home / Self Care | Payer: BC Managed Care – PPO | Source: Ambulatory Visit | Attending: Family Medicine | Admitting: Family Medicine

## 2020-04-27 DIAGNOSIS — R1011 Right upper quadrant pain: Secondary | ICD-10-CM | POA: Diagnosis not present

## 2020-04-27 DIAGNOSIS — K8020 Calculus of gallbladder without cholecystitis without obstruction: Secondary | ICD-10-CM | POA: Diagnosis not present

## 2020-04-28 ENCOUNTER — Encounter: Payer: Self-pay | Admitting: Surgery

## 2020-04-28 ENCOUNTER — Ambulatory Visit (INDEPENDENT_AMBULATORY_CARE_PROVIDER_SITE_OTHER): Payer: BC Managed Care – PPO | Admitting: Surgery

## 2020-04-28 ENCOUNTER — Telehealth: Payer: Self-pay | Admitting: Surgery

## 2020-04-28 VITALS — BP 141/87 | HR 96 | Temp 98.5°F | Ht 66.0 in | Wt 238.6 lb

## 2020-04-28 DIAGNOSIS — K8020 Calculus of gallbladder without cholecystitis without obstruction: Secondary | ICD-10-CM

## 2020-04-28 NOTE — Progress Notes (Signed)
04/28/2020  Reason for Visit:  Symptomatic cholelithiasis  Referring Provider:  Elsie Stain, MD  History of Present Illness: Maria Lawson is a 50 y.o. female presenting for evaluation of symptomatic cholelithiasis.  She had seen Dr. Bary Castilla in 2018, at which time she was doing better with PPI and it was decided to proceed with conservative management.  This worked for a long time, but more recently over the past few months, she's been having more frequent episodes of abdominal pain that is in the RUQ radiating to her right shoulder.  Denies any nausea or vomiting, and her main symptom is the pain.  She also reports having loose stools.  This happens after eating, but is not every meal necessarily.  Her most recent episode was last week and it lasted a while but was not severe to come to the ER.    Her PCP recently repeated labs and she got an U/S yesterday.  Her LFTs were normal but her lipase was elevated to 205.  She does report having been drinking that weekend prior to the labs.  Her U/S showed cholelithiasis, with gallstone about 0.9 cm in size.  She has been adhering to a very low fat diet and has lost weight recently.  Past Medical History: Past Medical History:  Diagnosis Date  . Allergy 10/12/1998  . Anxiety 12/14/2004  . Cholelithiasis   . Migraine with aura   . Panic attack      Past Surgical History: Past Surgical History:  Procedure Laterality Date  . APPENDECTOMY  1987  . VAGINAL DELIVERY      Home Medications: Prior to Admission medications   Medication Sig Start Date End Date Taking? Authorizing Provider  cetirizine (ZYRTEC) 10 MG tablet Take 1 tablet (10 mg total) by mouth daily. 04/16/15  Yes Tonia Ghent, MD  fluticasone [facility]) 50 MCG/ACT nasal spray Place 2 sprays into both nostrils daily.   Yes [provider]  Multiple Vitamin (MULTIVITAMIN ADULT PO) Take by mouth.   Yes [provider]  QUEtiapine (SEROQUEL) 25 MG tablet TAKE  1/2-1 TABLET BY MOUTH AT BEDTIME AS NEEDED FOR INSOMNIA 04/07/20  Yes Tonia Ghent, MD  sertraline (ZOLOFT) 100 MG tablet TAKE 1 TABLET BY MOUTH DAILY 04/07/20  Yes Tonia Ghent, MD    Allergies: Allergies  Allergen Reactions  . Latex     REACTION: rash    Social History:  reports that she has been smoking e-cigarettes. She has been smoking about 0.00 packs per day for the past 0.00 years. She has never used smokeless tobacco. She reports current alcohol use. She reports that she does not use drugs.   Family History: Family History  Problem Relation Age of Onset  . Hyperlipidemia Mother   . Breast cancer Mother   . Hypertension Father   . Hyperlipidemia Father   . Memory loss Father   . Colon cancer Maternal Uncle     Review of Systems: Review of Systems  Constitutional: Negative for chills and fever.  HENT: Negative for hearing loss.   Respiratory: Negative for shortness of breath.   Cardiovascular: Negative for chest pain.  Gastrointestinal: Positive for abdominal pain and diarrhea (loose stools). Negative for nausea and vomiting.  Genitourinary: Negative for dysuria.  Musculoskeletal: Negative for myalgias.  Skin: Negative for rash.  Neurological: Negative for dizziness.  Psychiatric/Behavioral: Negative for depression.    Physical Exam BP (!) 141/87   Pulse 96   Temp 98.5 F (36.9 C) (Oral)  Ht 5' 6" (1.676 m)   Wt 238 lb 9.6 oz (108.2 kg)   LMP 04/27/2020   SpO2 97%   BMI 38.51 kg/m  CONSTITUTIONAL: No acute distress HEENT:  Normocephalic, atraumatic, extraocular motion intact. NECK: Trachea is midline, and there is no jugular venous distension.  RESPIRATORY:  Normal respiratory effort without pathologic use of accessory muscles. CARDIOVASCULAR:  Regular rhythm and rate. GI: The abdomen is soft, non-distended, currently non-tender to palpation.  Negative Murphy's sign.  MUSCULOSKELETAL:  Normal muscle strength and tone in all four extremities.  No  peripheral edema or cyanosis. SKIN: Skin turgor is normal. There are no pathologic skin lesions.  NEUROLOGIC:  Motor and sensation is grossly normal.  Cranial nerves are grossly intact. PSYCH:  Alert and oriented to person, place and time. Affect is normal.  Laboratory Analysis: Labs from 04/19/20: Na 139, K 4.0, Cl 104, CO2 26, BUN 11, Cr 0.77.  Total bilirubin 0.3, AST 11, ALT 10, Alk Phos 59, Lipase 205.  WBC 8.8, Hgb 13.7, Hct 41, Plt 251.  Imaging: U/S 04/27/20: IMPRESSION: 1. 0.9 cm mobile gallstone in the gallbladder. No gallbladder wall thickening, pericholecystic fluid, or sonographic Murphy's sign.   Assessment and Plan: This is a 50 y.o. female with symptomatic cholelithiasis  --Discussed with the patient her laboratory results as well as her U/S findings.  Her LFTs are normal, but her lipase was mildly elevated.  I do not think this is related to her gallbladder (i.e. gallstone pancreatitis) as her LFTs were all normal.  This may be related to her alcohol intake.  Recommended that she try cutting down on alcohol intake as this may affect her pancreas as well.  Separately, also discussed with her that cutting down on smoking can only help [place] with her surgery and the wound healing process. --From the gallbladder standpoint, I think it's reasonable at this point to proceed with surgery, as conservative measures are not working well anymore.  Discussed with her the role for robotic assisted cholecystectomy and reviewed with her the risks of bleeding, infection, and injury to surrounding structures.  She's willing to proceed.  She understands that she would also need COVID-19 testing prior to surgery. --Based on her schedule, we will schedule her for surgery on 05/27/20.  Face-to-face time spent with the patient and care providers was 60 minutes, with more than 50% of the time spent counseling, educating, and coordinating care of the patient.     Melvyn Neth, [facility]

## 2020-04-28 NOTE — Patient Instructions (Addendum)
Our surgery scheduler Britta MccreedyBarbara will call you within 24-48 hours to get you scheduled. If you have not heard from her after 48 hours, please call our office. You will need to get Covid tested before surgery and have the blue sheet available when she calls to write down important information. If you have any concerns or questions, please feel free to call our office.    Minimally Invasive Cholecystectomy Minimally invasive cholecystectomy is surgery to remove the gallbladder. The gallbladder is a pear-shaped organ that lies beneath the liver on the right side of the body. The gallbladder stores bile, which is a fluid that helps the body digest fats. Cholecystectomy is often done to treat inflammation of the gallbladder (cholecystitis). This condition is usually caused by a buildup of gallstones (cholelithiasis) in the gallbladder. Gallstones can block the flow of bile, which can result in inflammation and pain. In severe cases, emergency surgery may be required. This procedure is done though small incisions in the abdomen, instead of one large incision. It is also called laparoscopic surgery. A thin scope with a camera (laparoscope) is inserted through one incision. Then surgical instruments are inserted through the other incisions. In some cases, a minimally invasive surgery may need to be changed to a surgery that is done through a larger incision. This is called open surgery. Tell a health care provider about:  Any allergies you have.  All medicines you are taking, including vitamins, herbs, eye drops, creams, and over-the-counter medicines.  Any problems you or family members have had with anesthetic medicines.  Any blood disorders you have.  Any surgeries you have had.  Any medical conditions you have.  Whether you are pregnant or may be pregnant. What are the risks? Generally, this is a safe procedure. However, problems may occur, including:  Infection.  Bleeding.  Allergic reactions  to medicines.  Damage to nearby structures or organs.  A stone remaining in the common bile duct. The common bile duct carries bile from the gallbladder into the small intestine.  A bile leak from the cyst duct that is clipped when your gallbladder is removed. What happens before the procedure? Staying hydrated Follow instructions from your health care provider about hydration, which may include:  Up to 2 hours before the procedure - you may continue to drink clear liquids, such as water, clear fruit juice, black coffee, and plain tea.   Eating and drinking restrictions Follow instructions from your health care provider about eating and drinking, which may include:  8 hours before the procedure - stop eating heavy meals or foods, such as meat, fried foods, or fatty foods.  6 hours before the procedure - stop eating light meals or foods, such as toast or cereal.  6 hours before the procedure - stop drinking milk or drinks that contain milk.  2 hours before the procedure - stop drinking clear liquids. Medicines Ask your health care provider about:  Changing or stopping your regular medicines. This is especially important if you are taking diabetes medicines or blood thinners.  Taking medicines such as aspirin and ibuprofen. These medicines can thin your blood. Do not take these medicines unless your health care provider tells you to take them.  Taking over-the-counter medicines, vitamins, herbs, and supplements. General instructions  Let your health care provider know if you develop a cold or an infection before surgery.  Plan to have someone take you home from the hospital or clinic.  If you will be going home right after the  procedure, plan to have someone with you for 24 hours.  Ask your health care provider: ? How your surgery site will be marked. ? What steps will be taken to help prevent infection. These may include:  Removing hair at the surgery site.  Washing skin  with a germ-killing soap.  Taking antibiotic medicine. What happens during the procedure?  An IV will be inserted into one of your veins.  You will be given one or both of the following: ? A medicine to help you relax (sedative). ? A medicine to make you fall asleep (general anesthetic).  A breathing tube will be placed in your mouth.  Your surgeon will make several small incisions in your abdomen.  The laparoscope will be inserted through one of the small incisions. The camera on the laparoscope will send images to a monitor in the operating room. This lets your surgeon see inside your abdomen.  A gas will be pumped into your abdomen. This will expand your abdomen to give the surgeon more room to perform the surgery.  Other tools that are needed for the procedure will be inserted through the other incisions. The gallbladder will be removed through one of the incisions.  Your common bile duct may be examined. If stones are found in the common bile duct, they may be removed.  After your gallbladder has been removed, the incisions will be closed with stitches (sutures), staples, or skin glue.  Your incisions may be covered with a bandage (dressing). The procedure may vary among health care providers and hospitals.   What happens after the procedure?  Your blood pressure, heart rate, breathing rate, and blood oxygen level will be monitored until you leave the hospital or clinic.  You will be given medicines as needed to control your pain.  If you were given a sedative during the procedure, it can affect you for several hours. Do not drive or operate machinery until your health care provider says that it is safe. Summary  Minimally invasive cholecystectomy, also called laparoscopic cholecystectomy, is surgery to remove the gallbladder using small incisions.  Tell your health care provider about all the medical conditions you have and all the medicines you are taking for those  conditions.  Before the procedure, follow instructions about eating or drinking restrictions and changing or stopping medicines.  If you were given a sedative during the procedure, it can affect you for several hours. Do not drive or operate machinery until your health care provider says that it is safe. This information is not intended to replace advice given to you by your health care provider. Make sure you discuss any questions you have with your health care provider. Document Revised: 11/04/2018 Document Reviewed: 11/04/2018 Elsevier Patient Education  2021 [employer] Inc.      Cholelithiasis  Cholelithiasis happens when gallstones form in the gallbladder. The gallbladder stores bile. Bile is a fluid that helps digest fats. Bile can harden and form into gallstones. If they cause a blockage, they can cause pain (gallbladder attack). What are the causes? This condition may be caused by:  Some blood diseases, such as sickle cell anemia.  Too much of a fat-like substance (cholesterol) in your bile.  Not enough bile salts in your bile. These salts help the body absorb and digest fats.  The gallbladder not emptying fully or often enough. This is common in pregnant women. What increases the risk? The following factors may make you more likely to develop this condition:  Being female.  Being pregnant many times.  Eating a lot of fried foods, fat, and refined carbs (refined carbohydrates).  Being very overweight (obese).  Being older than age 44.  Using medicines with female hormones in them for a long time.  Losing weight fast.  Having gallstones in your family.  Having some health problems, such as diabetes, Crohn's disease, or liver disease. What are the signs or symptoms? Often, there may be gallstones but no symptoms. These gallstones are called silent gallstones. If a gallstone causes a blockage, you may get sudden pain. The pain:  Can be in the upper right part of  your belly (abdomen).  Normally comes at night or after you eat.  Can last an hour or more.  Can spread to your right shoulder, back, or chest.  Can feel like discomfort, burning, or fullness in the upper part of your belly (indigestion). If the blockage lasts more than a few hours, you can get an infection or swelling. You may:  Feel like you may vomit.  Vomit.  Feel bloated.  Have belly pain for 5 hours or more.  Feel tender in your belly, often in the upper right part and under your ribs.  Have fever or chills.  Have skin or the white parts of your eyes turn yellow (jaundice).  Have dark pee (urine) or pale poop (stool). How is this treated? Treatment for this condition depends on how bad you feel. If you have symptoms, you may need:  Home care, if symptoms are not very bad. ? Do not eat for 12-24 hours. Drink only water and clear liquids. ? Start to eat simple or clear foods after 1 or 2 days. Try broths and crackers. ? You may need medicines for pain or stomach upset or both. ? If you have an infection, you will need antibiotics.  A hospital stay, if you have very bad pain or a very bad infection.  Surgery to remove your gallbladder. You may need this if: ? Gallstones keep coming back. ? You have very bad symptoms.  Medicines to break up gallstones. Medicines: ? Are best for small gallstones. ? May be used for up to 6-12 months.  A procedure to find and take out gallstones or to break up gallstones. Follow these instructions at home: Medicines  Take over-the-counter and prescription medicines only as told by your doctor.  If you were prescribed an antibiotic medicine, take it as told by your doctor. Do not stop taking the antibiotic even if you start to feel better.  Ask your doctor if the medicine prescribed to you requires you to avoid driving or using machinery. Eating and drinking  Drink enough fluid to keep your urine pale yellow. Drink water or clear  fluids. This is important when you have pain.  Eat healthy foods. Choose: ? Fewer fatty foods, such as fried foods. ? Fewer refined carbs. Avoid breads and grains that are highly processed, such as white bread and white rice. Choose whole grains, such as whole-wheat bread and brown rice. ? More fiber. Almonds, fresh fruit, and beans are healthy sources. General instructions  Keep a healthy weight.  Keep all follow-up visits as told by your doctor. This is important. Where to find more information  [employer] of Diabetes and Digestive and Kidney Diseases: [email or web address] Contact a doctor if:  You have sudden pain in the upper right part of your belly. Pain might spread to your right shoulder, back, or chest.  You have been diagnosed with gallstones  that have no symptoms and you get: ? Belly pain. ? Discomfort, burning, or fullness in the upper part of your abdomen.  You have dark urine or pale stools. Get help right away if:  You have sudden pain in the upper right part of your abdomen, and the pain lasts more than 2 hours.  You have pain in your abdomen, and: ? It lasts more than 5 hours. ? It keeps getting worse.  You have a fever or chills.  You keep feeling like you may vomit.  You keep vomiting.  Your skin or the white parts of your eyes turn yellow. Summary  Cholelithiasis happens when gallstones form in the gallbladder.  This condition may be caused by a blood disease, too much of a fat-like substance in the bile, or not enough bile salts in bile.  Treatment for this condition depends on how bad you feel.  If you have symptoms, do not eat or drink. You may need medicines. You may need a hospital stay for very bad pain or a very bad infection.  You may need surgery if gallstones keep coming back or if you have very bad symptoms. This information is not intended to replace advice given to you by your health care provider. Make sure you discuss any  questions you have with your health care provider. Document Revised: 03/21/2019 Document Reviewed: 12/23/2018 Elsevier Patient Education  2021 [employer].

## 2020-04-28 NOTE — Telephone Encounter (Signed)
Outgoing call is made, left message for patient to call.  Please inform patient of Pre-Admission date/time, COVID Testing date and Surgery date.  Surgery Date: 05/27/20 Preadmission Testing Date: 05/20/20 (phone 8a-1p) Covid Testing Date: 05/25/20 in person @ 8:05 am - patient [facility] to [facility] ([address] [facility])   Patient has been made aware to call [phone], between 1-3:00pm the day before surgery, to find out what time to arrive for surgery.

## 2020-04-29 NOTE — Telephone Encounter (Signed)
Patient calls back. She is now informed of all dates regarding her surgery and voices understanding.

## 2020-05-03 ENCOUNTER — Encounter: Payer: Self-pay | Admitting: Family Medicine

## 2020-05-03 ENCOUNTER — Ambulatory Visit (INDEPENDENT_AMBULATORY_CARE_PROVIDER_SITE_OTHER): Payer: BC Managed Care – PPO | Admitting: Family Medicine

## 2020-05-03 ENCOUNTER — Other Ambulatory Visit: Payer: Self-pay | Admitting: Family Medicine

## 2020-05-03 ENCOUNTER — Other Ambulatory Visit: Payer: Self-pay

## 2020-05-03 VITALS — BP 140/78 | HR 78 | Temp 98.1°F | Ht 66.0 in | Wt 236.0 lb

## 2020-05-03 DIAGNOSIS — R5383 Other fatigue: Secondary | ICD-10-CM

## 2020-05-03 DIAGNOSIS — Z7189 Other specified counseling: Secondary | ICD-10-CM

## 2020-05-03 DIAGNOSIS — R748 Abnormal levels of other serum enzymes: Secondary | ICD-10-CM | POA: Diagnosis not present

## 2020-05-03 DIAGNOSIS — E785 Hyperlipidemia, unspecified: Secondary | ICD-10-CM

## 2020-05-03 DIAGNOSIS — R1011 Right upper quadrant pain: Secondary | ICD-10-CM

## 2020-05-03 DIAGNOSIS — F4329 Adjustment disorder with other symptoms: Secondary | ICD-10-CM

## 2020-05-03 DIAGNOSIS — Z0000 Encounter for general adult medical examination without abnormal findings: Secondary | ICD-10-CM | POA: Diagnosis not present

## 2020-05-03 LAB — LIPID PANEL
Cholesterol: 215 mg/dL — ABNORMAL HIGH (ref 0–200)
HDL: 42.9 mg/dL (ref >39.00)
NonHDL: 172.28
Total CHOL/HDL Ratio: 5
Triglycerides: 272 mg/dL — ABNORMAL HIGH (ref 0.0–149.0)
VLDL: 54.4 mg/dL — ABNORMAL HIGH (ref 0.0–40.0)

## 2020-05-03 LAB — LDL CHOLESTEROL, DIRECT: Direct LDL: 127 mg/dL

## 2020-05-03 LAB — VITAMIN D 25 HYDROXY (VIT D DEFICIENCY, FRACTURES): VITD: 24.74 ng/mL — ABNORMAL LOW (ref 30.00–100.00)

## 2020-05-03 LAB — TSH: TSH: 1.46 u[IU]/mL (ref 0.35–4.50)

## 2020-05-03 LAB — VITAMIN B12: Vitamin B-12: 195 pg/mL — ABNORMAL LOW (ref 211–911)

## 2020-05-03 LAB — IRON: Iron: 29 ug/dL — ABNORMAL LOW (ref 42–145)

## 2020-05-03 LAB — LIPASE: Lipase: 51 U/L (ref 11.0–59.0)

## 2020-05-03 NOTE — Progress Notes (Signed)
This visit occurred during the SARS-CoV-2 public health emergency.  Safety protocols were in place, including screening questions prior to the visit, additional usage of staff PPE, and extensive cleaning of exam room while observing appropriate contact time as indicated for disinfecting solutions.  CPE- See plan.  Routine anticipatory guidance given to patient.  See health maintenance.  The possibility exists that previously documented standard health maintenance information may have been brought forward from a previous encounter into this note.  If needed, that same information has been updated to reflect the current situation based on today's encounter.   Tetanus 2017 Flu to be done at pharmacy.  PNA and shingles not due.  Flu vaccine encouraged.  covid vaccine encouraged.   Colon cancer screening d/w pt.  Defer given pending cholecystectomy.   Mammogram due, d/w pt.  DXA not due.  Pap smear 2017.  Living will d/w pt. Oldest son designated if patient were incapacitated.   Diet and exercise d/w pt.  HIV prev neg ~2008 per patient report.  History of fatigue.  See notes on labs.  History of hyperlipidemia.  See notes on labs.  Mood d/w pt.  "I'm going pretty well, all things considered."  No SI/HI.  Compliant with meds.   RUQ sx d/w pt.  On sig low fat diet and that helps some.  Better than prev, with plan for surgery next month.  Discussed rechecking lipase.  She had alcohol prior to her previous labs.  No alcohol in the meantime.  Smoking 1 PPD.  D/w pt.  She is considering vaping then tapering.  Discussed options.  PMH and SH reviewed  Meds, vitals, and allergies reviewed.   ROS: Per HPI.  Unless specifically indicated otherwise in HPI, the patient denies:  General: fever. Eyes: acute vision changes ENT: sore throat Cardiovascular: chest pain Respiratory: SOB GI: vomiting GU: dysuria Musculoskeletal: acute back pain Derm: acute rash Neuro: acute motor  dysfunction Psych: worsening mood Endocrine: polydipsia Heme: bleeding Allergy: hayfever  GEN: nad, alert and oriented HEENT: ncat NECK: supple w/o LA CV: rrr. PULM: ctab, no inc wob ABD: soft, +bs, not tender to palpation. EXT: no edema SKIN: no acute rash

## 2020-05-03 NOTE — Patient Instructions (Addendum)
Low fat diet in the meantime.   You can call for a mammogram at [facility] at [facility].  [address] [place] [phone] [id number]  Go to the lab on the way out.   If you have mychart we'll likely use that to update you.     Take care.  Glad to see you.

## 2020-05-05 NOTE — Assessment & Plan Note (Signed)
Tetanus 2017 Flu to be done at pharmacy.  PNA and shingles not due.  Flu vaccine encouraged.  covid vaccine encouraged.   Colon cancer screening d/w pt.  Defer given pending cholecystectomy.   Mammogram due, d/w pt.  DXA not due.  Pap smear 2017.  Living will d/w pt. Oldest son designated if patient were incapacitated.   Diet and exercise d/w pt.  HIV prev neg ~2008 per patient report.

## 2020-05-05 NOTE — Assessment & Plan Note (Signed)
Mood d/w pt.  "I'm going pretty well, all things considered."  No SI/HI.  Compliant with meds.  Continue sertraline and quetiapine.

## 2020-05-05 NOTE — Assessment & Plan Note (Signed)
Living will d/w pt. Oldest son designated if patient were incapacitated.

## 2020-05-05 NOTE — Assessment & Plan Note (Addendum)
Would continue low-fat diet.  Recheck labs today.  See notes on labs.  Discussed limiting alcohol.  She will follow up with surgery for cholecystectomy.

## 2020-05-07 ENCOUNTER — Encounter: Payer: Self-pay | Admitting: Family Medicine

## 2020-05-07 ENCOUNTER — Other Ambulatory Visit: Payer: Self-pay | Admitting: Family Medicine

## 2020-05-07 DIAGNOSIS — E559 Vitamin D deficiency, unspecified: Secondary | ICD-10-CM

## 2020-05-07 DIAGNOSIS — E611 Iron deficiency: Secondary | ICD-10-CM

## 2020-05-07 DIAGNOSIS — E538 Deficiency of other specified B group vitamins: Secondary | ICD-10-CM

## 2020-05-07 MED ORDER — CYANOCOBALAMIN 1000 MCG/ML IJ SOLN: 1000.0000 ug | INTRAMUSCULAR | Status: DC

## 2020-05-07 MED ORDER — VITAMIN D 50 MCG (2000 UT) PO CAPS: 2000.0000 [IU] | ORAL_CAPSULE | Freq: Every day | ORAL | Status: AC

## 2020-05-07 MED ORDER — IRON 325 (65 FE) MG PO TABS: 325.0000 mg | ORAL_TABLET | Freq: Every day | ORAL | 0 refills | Status: DC

## 2020-05-09 ENCOUNTER — Other Ambulatory Visit: Payer: Self-pay | Admitting: Family Medicine

## 2020-05-09 MED ORDER — CYANOCOBALAMIN 1000 MCG/ML IJ SOLN: 1000.0000 ug | INTRAMUSCULAR | 12 refills | Status: DC

## 2020-05-20 ENCOUNTER — Other Ambulatory Visit
Admission: RE | Admit: 2020-05-20 | Discharge: 2020-05-20 | Disposition: A | Discharge status: Home / Self Care | Payer: BC Managed Care – PPO | Source: Ambulatory Visit | Attending: Surgery | Admitting: Surgery

## 2020-05-20 ENCOUNTER — Other Ambulatory Visit: Payer: Self-pay

## 2020-05-20 HISTORY — DX: Anemia, unspecified: D64.9

## 2020-05-20 NOTE — Patient Instructions (Signed)
Your procedure is scheduled on: Thursday May 27, 2020. Report to Day Surgery inside [facility] 2nd floor (stop by admissions desk first before getting on elevator). To find out your arrival time please call [phone] between 1PM - 3PM on Wednesday May 26, 2020.  Remember: Instructions that are not followed completely may result in serious medical risk,  up to and including death, or upon the discretion of your surgeon and anesthesiologist your  surgery may need to be rescheduled.     _X__ 1. Do not eat food after midnight the night before your procedure.                 No chewing gum or hard candies. You may drink clear liquids up to 2 hours                 before you are scheduled to arrive for your surgery- DO not drink clear                 liquids within 2 hours of the start of your surgery.                 Clear Liquids include:  water, apple juice without pulp, clear Gatorade, G2 or                  Gatorade Zero (avoid Red/Purple/Blue), Black Coffee or Tea (Do not add                 anything to coffee or tea).  __X__2.  On the morning of surgery brush your teeth with toothpaste and water, you                may rinse your mouth with mouthwash if you wish.  Do not swallow any toothpaste of mouthwash.     _X__ 3.  No Alcohol for 24 hours before or after surgery.   _X__ 4.  Do Not Smoke or use e-cigarettes For 24 Hours Prior to Your Surgery.                 Do not use any chewable tobacco products for at least 6 hours prior to                 Surgery.  _X__  5.  Do not use any recreational drugs (marijuana, cocaine, heroin, ecstasy, MDMA or other)                For at least one week prior to your surgery.  Combination of these drugs with anesthesia                May have life threatening results.  __X__6.  Notify your doctor if there is any change in your medical condition      (cold, fever, infections).     Do not wear jewelry, make-up,  hairpins, clips or nail polish. Do not wear lotions, powders, or perfumes. You may wear deodorant. Do not shave 48 hours prior to surgery. Men may shave face and neck. Do not bring valuables to the hospital.    [facility] is not responsible for any belongings or valuables.  Contacts, dentures or bridgework may not be worn into surgery. Leave your suitcase in the car. After surgery it may be brought to your room. For patients admitted to the hospital, discharge time is determined by your treatment team.   Patients discharged the day of surgery will not be allowed to drive home.  Make arrangements for someone to be with you for the first 24 hours of your Same Day Discharge.   __X__ Take these medicines the morning of surgery with A SIP OF WATER:    1. None   2.   3.   4.  5.  6.  ____ Fleet Enema (as directed)   __X__ Use CHG Soap (or wipes) as directed  ____ Use Benzoyl Peroxide Gel as instructed  ____ Use inhalers on the day of surgery  ____ Stop metformin 2 days prior to surgery    ____ Take 1/2 of usual insulin dose the night before surgery. No insulin the morning          of surgery.   __X__ Stop Anti-inflammatories such as Ibuprofen, Aleve, Advil, naproxen, Motrin, aspirin, Goody's or BC powders.    __X__ Stop supplements until after surgery.    __X__ Do not start any herbal supplements before your procedure.    If you have any questions regarding your pre-procedure instructions,  Please call Pre-admit Testing at [phone].

## 2020-05-25 ENCOUNTER — Other Ambulatory Visit: Payer: Self-pay

## 2020-05-25 ENCOUNTER — Other Ambulatory Visit
Admission: RE | Admit: 2020-05-25 | Discharge: 2020-05-25 | Disposition: A | Discharge status: Home / Self Care | Payer: BC Managed Care – PPO | Source: Ambulatory Visit | Attending: Surgery | Admitting: Surgery

## 2020-05-25 DIAGNOSIS — Z01812 Encounter for preprocedural laboratory examination: Secondary | ICD-10-CM | POA: Insufficient documentation

## 2020-05-25 DIAGNOSIS — Z20822 Contact with and (suspected) exposure to covid-19: Secondary | ICD-10-CM | POA: Diagnosis not present

## 2020-05-25 LAB — SARS CORONAVIRUS 2 (TAT 6-24 HRS): SARS Coronavirus 2: NEGATIVE

## 2020-05-26 ENCOUNTER — Telehealth: Payer: Self-pay | Admitting: Surgery

## 2020-05-26 NOTE — Telephone Encounter (Signed)
Outgoing call is made, left message for patient to call.  Please advise patient of Pre-Admission date/time, COVID Testing date and Surgery date.  Surgery Date: 06/10/20 Preadmission Testing Date: 05/20/20 (phone done) Covid Testing Date: No Covid test needed per pre-admit.    Also patient to call at [phone], between 1-3:00pm the day before surgery, to find out what time to arrive for surgery.

## 2020-05-31 NOTE — Telephone Encounter (Signed)
Left another message for patient to call me so that I confirm rescheduled date for her surgery at her request.

## 2020-06-02 NOTE — Telephone Encounter (Signed)
Outgoing call is made again.  This time was able to speak with the patient.  She is aware of her rescheduled surgery date at her request and voices understanding.

## 2020-06-08 ENCOUNTER — Other Ambulatory Visit: Payer: Self-pay

## 2020-06-08 ENCOUNTER — Other Ambulatory Visit
Admission: RE | Admit: 2020-06-08 | Discharge: 2020-06-08 | Disposition: A | Discharge status: Home / Self Care | Payer: BC Managed Care – PPO | Source: Ambulatory Visit | Attending: Surgery | Admitting: Surgery

## 2020-06-08 DIAGNOSIS — Z01812 Encounter for preprocedural laboratory examination: Secondary | ICD-10-CM | POA: Insufficient documentation

## 2020-06-08 DIAGNOSIS — Z20822 Contact with and (suspected) exposure to covid-19: Secondary | ICD-10-CM | POA: Insufficient documentation

## 2020-06-08 DIAGNOSIS — F17290 Nicotine dependence, other tobacco product, uncomplicated: Secondary | ICD-10-CM | POA: Diagnosis not present

## 2020-06-08 DIAGNOSIS — K8010 Calculus of gallbladder with chronic cholecystitis without obstruction: Secondary | ICD-10-CM | POA: Diagnosis not present

## 2020-06-08 DIAGNOSIS — Z79899 Other long term (current) drug therapy: Secondary | ICD-10-CM | POA: Diagnosis not present

## 2020-06-09 LAB — SARS CORONAVIRUS 2 (TAT 6-24 HRS): SARS Coronavirus 2: NEGATIVE

## 2020-06-09 MED ORDER — CHLORHEXIDINE GLUCONATE 0.12 % MT SOLN: 15.0000 mL | Freq: Once | OROMUCOSAL | Status: AC

## 2020-06-09 MED ORDER — CHLORHEXIDINE GLUCONATE CLOTH 2 % EX PADS
6.0000 | MEDICATED_PAD | Freq: Once | CUTANEOUS | Status: AC
  Administered 2020-06-10: 6 via TOPICAL

## 2020-06-09 MED ORDER — INDOCYANINE GREEN 25 MG IV SOLR
2.5000 mg | INTRAVENOUS | Status: AC
  Administered 2020-06-10: 2.5 mg via INTRAVENOUS
  Filled 2020-06-09: qty 10

## 2020-06-09 MED ORDER — ACETAMINOPHEN 500 MG PO TABS: 1000.0000 mg | ORAL_TABLET | ORAL | Status: AC

## 2020-06-09 MED ORDER — ORAL CARE MOUTH RINSE: 15.0000 mL | Freq: Once | OROMUCOSAL | Status: AC

## 2020-06-09 MED ORDER — LACTATED RINGERS IV SOLN: INTRAVENOUS | Status: DC

## 2020-06-09 MED ORDER — CEFAZOLIN SODIUM-DEXTROSE 2-4 GM/100ML-% IV SOLN
2.0000 g | INTRAVENOUS | Status: AC
Start: 2020-06-10 — End: 2020-06-10
  Administered 2020-06-10: 2 g via INTRAVENOUS

## 2020-06-09 MED ORDER — GABAPENTIN 300 MG PO CAPS: 300.0000 mg | ORAL_CAPSULE | ORAL | Status: AC

## 2020-06-09 MED ORDER — FAMOTIDINE 20 MG PO TABS: 20.0000 mg | ORAL_TABLET | Freq: Once | ORAL | Status: AC

## 2020-06-10 ENCOUNTER — Other Ambulatory Visit: Payer: Self-pay

## 2020-06-10 ENCOUNTER — Encounter
Admission: RE | Disposition: A | Discharge status: Home / Self Care | Payer: Self-pay | Source: Home / Self Care | Attending: Surgery

## 2020-06-10 ENCOUNTER — Encounter: Payer: Self-pay | Admitting: Surgery

## 2020-06-10 ENCOUNTER — Ambulatory Visit
Admission: RE | Admit: 2020-06-10 | Discharge: 2020-06-10 | Disposition: A | Discharge status: Home / Self Care | Payer: BC Managed Care – PPO | Attending: Surgery | Admitting: Surgery

## 2020-06-10 ENCOUNTER — Ambulatory Visit: Payer: BC Managed Care – PPO | Admitting: Anesthesiology

## 2020-06-10 DIAGNOSIS — K8010 Calculus of gallbladder with chronic cholecystitis without obstruction: Secondary | ICD-10-CM | POA: Insufficient documentation

## 2020-06-10 DIAGNOSIS — F17290 Nicotine dependence, other tobacco product, uncomplicated: Secondary | ICD-10-CM | POA: Diagnosis not present

## 2020-06-10 DIAGNOSIS — K8020 Calculus of gallbladder without cholecystitis without obstruction: Secondary | ICD-10-CM

## 2020-06-10 DIAGNOSIS — Z79899 Other long term (current) drug therapy: Secondary | ICD-10-CM | POA: Insufficient documentation

## 2020-06-10 DIAGNOSIS — Z20822 Contact with and (suspected) exposure to covid-19: Secondary | ICD-10-CM | POA: Diagnosis not present

## 2020-06-10 LAB — URINE DRUG SCREEN, QUALITATIVE (ARMC ONLY)
Amphetamines, Ur Screen: NOT DETECTED
Barbiturates, Ur Screen: NOT DETECTED
Benzodiazepine, Ur Scrn: POSITIVE — AB
Cannabinoid 50 Ng, Ur ~~LOC~~: NOT DETECTED
Cocaine Metabolite,Ur ~~LOC~~: NOT DETECTED
MDMA (Ecstasy)Ur Screen: NOT DETECTED
Methadone Scn, Ur: NOT DETECTED
Opiate, Ur Screen: NOT DETECTED
Phencyclidine (PCP) Ur S: NOT DETECTED
Tricyclic, Ur Screen: POSITIVE — AB

## 2020-06-10 LAB — POCT PREGNANCY, URINE: Preg Test, Ur: NEGATIVE

## 2020-06-10 SURGERY — CHOLECYSTECTOMY, ROBOT-ASSISTED, LAPAROSCOPIC
Anesthesia: General

## 2020-06-10 MED ORDER — MIDAZOLAM HCL 2 MG/2ML IJ SOLN
INTRAMUSCULAR | Status: DC | PRN
  Administered 2020-06-10: 2 mg via INTRAVENOUS

## 2020-06-10 MED ORDER — GABAPENTIN 300 MG PO CAPS
ORAL_CAPSULE | ORAL | Status: AC
  Administered 2020-06-10: 300 mg via ORAL
  Filled 2020-06-10: qty 1

## 2020-06-10 MED ORDER — OXYCODONE HCL 5 MG PO TABS: 5.0000 mg | ORAL_TABLET | Freq: Four times a day (QID) | ORAL | 0 refills | Status: DC | PRN

## 2020-06-10 MED ORDER — PROPOFOL 500 MG/50ML IV EMUL
INTRAVENOUS | Status: AC
  Filled 2020-06-10: qty 50

## 2020-06-10 MED ORDER — OXYCODONE HCL 5 MG PO TABS
5.0000 mg | ORAL_TABLET | Freq: Once | ORAL | Status: AC | PRN
  Administered 2020-06-10: 5 mg via ORAL

## 2020-06-10 MED ORDER — FAMOTIDINE 20 MG PO TABS
ORAL_TABLET | ORAL | Status: AC
  Administered 2020-06-10: 20 mg via ORAL
  Filled 2020-06-10: qty 1

## 2020-06-10 MED ORDER — CEFAZOLIN SODIUM-DEXTROSE 2-4 GM/100ML-% IV SOLN
INTRAVENOUS | Status: AC
  Filled 2020-06-10: qty 100

## 2020-06-10 MED ORDER — FENTANYL CITRATE (PF) 100 MCG/2ML IJ SOLN
INTRAMUSCULAR | Status: AC
  Filled 2020-06-10: qty 2

## 2020-06-10 MED ORDER — GLYCOPYRROLATE 0.2 MG/ML IJ SOLN
INTRAMUSCULAR | Status: DC | PRN
  Administered 2020-06-10: .2 mg via INTRAVENOUS

## 2020-06-10 MED ORDER — DEXMEDETOMIDINE (PRECEDEX) IN NS 20 MCG/5ML (4 MCG/ML) IV SYRINGE
PREFILLED_SYRINGE | INTRAVENOUS | Status: DC | PRN
  Administered 2020-06-10: 8 ug via INTRAVENOUS

## 2020-06-10 MED ORDER — SODIUM CHLORIDE 0.9 % IV SOLN
INTRAVENOUS | Status: DC | PRN
  Administered 2020-06-10: 20 ug/min via INTRAVENOUS

## 2020-06-10 MED ORDER — SUGAMMADEX SODIUM 500 MG/5ML IV SOLN
INTRAVENOUS | Status: DC | PRN
  Administered 2020-06-10: 400 mg via INTRAVENOUS

## 2020-06-10 MED ORDER — SODIUM CHLORIDE FLUSH 0.9 % IV SOLN
INTRAVENOUS | Status: AC
  Filled 2020-06-10: qty 10

## 2020-06-10 MED ORDER — IBUPROFEN 800 MG PO TABS
800.0000 mg | ORAL_TABLET | Freq: Three times a day (TID) | ORAL | 1 refills | Status: DC | PRN
Start: 2020-06-10 — End: 2020-08-27

## 2020-06-10 MED ORDER — PROPOFOL 10 MG/ML IV BOLUS
INTRAVENOUS | Status: DC | PRN
  Administered 2020-06-10: 200 mg via INTRAVENOUS

## 2020-06-10 MED ORDER — CHLORHEXIDINE GLUCONATE 0.12 % MT SOLN
OROMUCOSAL | Status: AC
  Administered 2020-06-10: 15 mL via OROMUCOSAL
  Filled 2020-06-10: qty 15

## 2020-06-10 MED ORDER — ACETAMINOPHEN 500 MG PO TABS: 1000.0000 mg | ORAL_TABLET | Freq: Four times a day (QID) | ORAL | Status: AC | PRN

## 2020-06-10 MED ORDER — LIDOCAINE HCL (CARDIAC) PF 100 MG/5ML IV SOSY
PREFILLED_SYRINGE | INTRAVENOUS | Status: DC | PRN
  Administered 2020-06-10: 100 mg via INTRAVENOUS

## 2020-06-10 MED ORDER — DEXAMETHASONE SODIUM PHOSPHATE 10 MG/ML IJ SOLN
INTRAMUSCULAR | Status: DC | PRN
  Administered 2020-06-10: 10 mg via INTRAVENOUS

## 2020-06-10 MED ORDER — HYDROMORPHONE HCL 1 MG/ML IJ SOLN
INTRAMUSCULAR | Status: DC | PRN
  Administered 2020-06-10 (×2): .5 mg via INTRAVENOUS

## 2020-06-10 MED ORDER — FENTANYL CITRATE (PF) 100 MCG/2ML IJ SOLN
25.0000 ug | INTRAMUSCULAR | Status: DC | PRN
  Administered 2020-06-10: 25 ug via INTRAVENOUS

## 2020-06-10 MED ORDER — SUCCINYLCHOLINE CHLORIDE 20 MG/ML IJ SOLN
INTRAMUSCULAR | Status: DC | PRN
  Administered 2020-06-10: 200 mg via INTRAVENOUS

## 2020-06-10 MED ORDER — ACETAMINOPHEN 500 MG PO TABS
ORAL_TABLET | ORAL | Status: AC
  Administered 2020-06-10: 1000 mg via ORAL
  Filled 2020-06-10: qty 2

## 2020-06-10 MED ORDER — OXYCODONE HCL 5 MG PO TABS
ORAL_TABLET | ORAL | Status: AC
  Filled 2020-06-10: qty 1

## 2020-06-10 MED ORDER — ALBUTEROL SULFATE HFA 108 (90 BASE) MCG/ACT IN AERS
INHALATION_SPRAY | RESPIRATORY_TRACT | Status: DC | PRN
  Administered 2020-06-10: 6 via RESPIRATORY_TRACT

## 2020-06-10 MED ORDER — KETOROLAC TROMETHAMINE 30 MG/ML IJ SOLN
INTRAMUSCULAR | Status: DC | PRN
  Administered 2020-06-10: 15 mg via INTRAVENOUS

## 2020-06-10 MED ORDER — ROCURONIUM BROMIDE 100 MG/10ML IV SOLN
INTRAVENOUS | Status: DC | PRN
  Administered 2020-06-10: 20 mg via INTRAVENOUS
  Administered 2020-06-10: 60 mg via INTRAVENOUS

## 2020-06-10 MED ORDER — ONDANSETRON HCL 4 MG/2ML IJ SOLN
INTRAMUSCULAR | Status: DC | PRN
  Administered 2020-06-10: 4 mg via INTRAVENOUS

## 2020-06-10 MED ORDER — PHENYLEPHRINE HCL (PRESSORS) 10 MG/ML IV SOLN
INTRAVENOUS | Status: DC | PRN
  Administered 2020-06-10: 200 ug via INTRAVENOUS
  Administered 2020-06-10: 100 ug via INTRAVENOUS
  Administered 2020-06-10 (×2): 200 ug via INTRAVENOUS

## 2020-06-10 MED ORDER — FENTANYL CITRATE (PF) 100 MCG/2ML IJ SOLN
INTRAMUSCULAR | Status: DC | PRN
  Administered 2020-06-10 (×2): 50 ug via INTRAVENOUS

## 2020-06-10 MED ORDER — MIDAZOLAM HCL 2 MG/2ML IJ SOLN
INTRAMUSCULAR | Status: AC
  Filled 2020-06-10: qty 2

## 2020-06-10 MED ORDER — HYDROMORPHONE HCL 1 MG/ML IJ SOLN
INTRAMUSCULAR | Status: AC
  Filled 2020-06-10: qty 1

## 2020-06-10 MED ORDER — BUPIVACAINE-EPINEPHRINE (PF) 0.25% -1:200000 IJ SOLN
INTRAMUSCULAR | Status: DC | PRN
  Administered 2020-06-10: 30 mL

## 2020-06-10 MED ORDER — OXYCODONE HCL 5 MG/5ML PO SOLN: 5.0000 mg | Freq: Once | ORAL | Status: AC | PRN

## 2020-06-10 MED ORDER — BUPIVACAINE-EPINEPHRINE (PF) 0.25% -1:200000 IJ SOLN
INTRAMUSCULAR | Status: AC
  Filled 2020-06-10: qty 30

## 2020-06-10 SURGICAL SUPPLY — 56 items
ADH SKN CLS APL DERMABOND .7 (GAUZE/BANDAGES/DRESSINGS) ×1
APL PRP STRL LF DISP 70% ISPRP (MISCELLANEOUS) ×1
BAG INFUSER PRESSURE 100CC (MISCELLANEOUS) IMPLANT
BAG SPEC RTRVL LRG 6X4 10 (ENDOMECHANICALS) ×1
CANISTER SUCT 1200ML W/VALVE (MISCELLANEOUS) IMPLANT
CANNULA REDUC XI 12-8 STAPL (CANNULA) ×2
CANNULA REDUCER 12-8 DVNC XI (CANNULA) ×1 IMPLANT
CHLORAPREP W/TINT 26 (MISCELLANEOUS) ×2 IMPLANT
CLIP VESOLOCK MED LG 6/CT (CLIP) ×4 IMPLANT
COVER WAND RF STERILE (DRAPES) ×2 IMPLANT
CUP MEDICINE 2OZ PLAST GRAD ST (MISCELLANEOUS) IMPLANT
DECANTER SPIKE VIAL GLASS SM (MISCELLANEOUS) ×2 IMPLANT
DEFOGGER SCOPE WARMER CLEARIFY (MISCELLANEOUS) ×2 IMPLANT
DERMABOND ADVANCED (GAUZE/BANDAGES/DRESSINGS) ×1
DERMABOND ADVANCED .7 DNX12 (GAUZE/BANDAGES/DRESSINGS) ×1 IMPLANT
DRAPE ARM DVNC X/XI (DISPOSABLE) ×4 IMPLANT
DRAPE COLUMN DVNC XI (DISPOSABLE) ×1 IMPLANT
DRAPE DA VINCI XI ARM (DISPOSABLE) ×8
DRAPE DA VINCI XI COLUMN (DISPOSABLE) ×2
ELECT CAUTERY BLADE TIP 2.5 (TIP) ×2
ELECT REM PT RETURN 9FT ADLT (ELECTROSURGICAL) ×2
ELECTRODE CAUTERY BLDE TIP 2.5 (TIP) ×1 IMPLANT
ELECTRODE REM PT RTRN 9FT ADLT (ELECTROSURGICAL) ×1 IMPLANT
GLOVE SURG SYN 7.0 (GLOVE) ×10 IMPLANT
GLOVE SURG SYN 7.5  E (GLOVE) ×10
GLOVE SURG SYN 7.5 E (GLOVE) ×5 IMPLANT
GOWN STRL REUS W/ TWL LRG LVL3 (GOWN DISPOSABLE) ×6 IMPLANT
GOWN STRL REUS W/TWL LRG LVL3 (GOWN DISPOSABLE) ×12
IRRIGATOR SUCT 8 DISP DVNC XI (IRRIGATION / IRRIGATOR) IMPLANT
IRRIGATOR SUCTION 8MM XI DISP (IRRIGATION / IRRIGATOR)
IV NS 1000ML (IV SOLUTION)
IV NS 1000ML BAXH (IV SOLUTION) IMPLANT
KIT PINK PAD W/HEAD ARE REST (MISCELLANEOUS) ×2
KIT PINK PAD W/HEAD ARM REST (MISCELLANEOUS) ×1 IMPLANT
LABEL OR SOLS (LABEL) ×2 IMPLANT
MANIFOLD NEPTUNE II (INSTRUMENTS) ×2 IMPLANT
NEEDLE HYPO 22GX1.5 SAFETY (NEEDLE) ×2 IMPLANT
NS IRRIG 500ML POUR BTL (IV SOLUTION) ×2 IMPLANT
OBTURATOR OPTICAL STANDARD 8MM (TROCAR) ×2
OBTURATOR OPTICAL STND 8 DVNC (TROCAR) ×1
OBTURATOR OPTICALSTD 8 DVNC (TROCAR) ×1 IMPLANT
PACK LAP CHOLECYSTECTOMY (MISCELLANEOUS) ×2 IMPLANT
PENCIL ELECTRO HAND CTR (MISCELLANEOUS) ×2 IMPLANT
POUCH SPECIMEN RETRIEVAL 10MM (ENDOMECHANICALS) ×2 IMPLANT
SEAL CANN UNIV 5-8 DVNC XI (MISCELLANEOUS) ×4 IMPLANT
SEAL XI 5MM-8MM UNIVERSAL (MISCELLANEOUS) ×8
SET TUBE SMOKE EVAC HIGH FLOW (TUBING) ×2 IMPLANT
SOLUTION ELECTROLUBE (MISCELLANEOUS) ×2 IMPLANT
SPONGE LAP 18X18 RF (DISPOSABLE) IMPLANT
SPONGE LAP 4X18 RFD (DISPOSABLE) ×2 IMPLANT
STAPLER CANNULA SEAL DVNC XI (STAPLE) ×1 IMPLANT
STAPLER CANNULA SEAL XI (STAPLE) ×2
SUT MNCRL AB 4-0 PS2 18 (SUTURE) ×2 IMPLANT
SUT VICRYL 0 AB UR-6 (SUTURE) ×4 IMPLANT
TAPE TRANSPORE STRL 2 31045 (GAUZE/BANDAGES/DRESSINGS) ×2 IMPLANT
TROCAR BALLN GELPORT 12X130M (ENDOMECHANICALS) ×2 IMPLANT

## 2020-06-10 NOTE — Anesthesia Preprocedure Evaluation (Signed)
Anesthesia Evaluation  Patient identified by MRN, date of birth, ID band Patient awake    Reviewed: Allergy & Precautions, H&P , NPO status , Patient's Chart, lab work & pertinent test results  History of Anesthesia Complications Negative for: history of anesthetic complications  Airway Mallampati: III  TM Distance: >3 FB Neck ROM: full    Dental  (+) Chipped   Pulmonary neg shortness of breath, Current Smoker and Patient abstained from smoking.,    Pulmonary exam normal        Cardiovascular Exercise Tolerance: Good (-) angina(-) Past MI and (-) DOE negative cardio ROS Normal cardiovascular exam     Neuro/Psych  Headaches, PSYCHIATRIC DISORDERS    GI/Hepatic Neg liver ROS, GERD  Medicated and Controlled,  Endo/Other  negative endocrine ROS  Renal/GU      Musculoskeletal   Abdominal   Peds  Hematology negative hematology ROS (+)   Anesthesia Other Findings Past Medical History: 10/12/1998: Allergy No date: Anemia 12/14/2004: Anxiety No date: Cholelithiasis No date: Migraine with aura No date: Panic attack  Past Surgical History: 1987: APPENDECTOMY No date: VAGINAL DELIVERY  BMI    Body Mass Index: 36.64 kg/m      Reproductive/Obstetrics negative OB ROS                             Anesthesia Physical Anesthesia Plan  ASA: II  Anesthesia Plan: General ETT   Post-op Pain Management:    Induction: Intravenous  PONV Risk Score and Plan: Ondansetron, Dexamethasone, Midazolam and Treatment may vary due to age or medical condition  Airway Management Planned: Oral ETT  Additional Equipment:   Intra-op Plan:   Post-operative Plan: Extubation in OR  Informed Consent: I have reviewed the patients History and Physical, chart, labs and discussed the procedure including the risks, benefits and alternatives for the proposed anesthesia with the patient or authorized  representative who has indicated his/her understanding and acceptance.     Dental Advisory Given  Plan Discussed with: Anesthesiologist, CRNA and Surgeon  Anesthesia Plan Comments: (Patient consented for risks of anesthesia including but not limited to:  - adverse reactions to medications - damage to eyes, teeth, lips or other oral mucosa - nerve damage due to positioning  - sore throat or hoarseness - Damage to heart, brain, nerves, lungs, other parts of body or loss of life  Patient voiced understanding.)        Anesthesia Quick Evaluation

## 2020-06-10 NOTE — H&P (Signed)
06/10/20  Reason for Visit:  Symptomatic cholelithiasis  Referring Provider:  Elsie Stain, MD  History of Present Illness: Maria Lawson is a 50 y.o. female presenting for evaluation of symptomatic cholelithiasis.  She had seen Dr. Bary Castilla in 2018, at which time she was doing better with PPI and it was decided to proceed with conservative management.  This worked for a long time, but more recently over the past few months, she's been having more frequent episodes of abdominal pain that is in the RUQ radiating to her right shoulder.  Denies any nausea or vomiting, and her main symptom is the pain.  She also reports having loose stools.  This happens after eating, but is not every meal necessarily.  Her most recent episode was last week and it lasted a while but was not severe to come to the ER.    Her PCP recently repeated labs and she got an U/S yesterday.  Her LFTs were normal but her lipase was elevated to 205.  She does report having been drinking that weekend prior to the labs.  Her U/S showed cholelithiasis, with gallstone about 0.9 cm in size.  She has been adhering to a very low fat diet and has lost weight recently.  Past Medical History:     Past Medical History:  Diagnosis Date  . Allergy 10/12/1998  . Anxiety 12/14/2004  . Cholelithiasis   . Migraine with aura   . Panic attack      Past Surgical History:      Past Surgical History:  Procedure Laterality Date  . APPENDECTOMY  1987  . VAGINAL DELIVERY      Home Medications:        Prior to Admission medications   Medication Sig Start Date End Date Taking? Authorizing Provider  cetirizine (ZYRTEC) 10 MG tablet Take 1 tablet (10 mg total) by mouth daily. 04/16/15  Yes Tonia Ghent, MD  fluticasone [facility]) 50 MCG/ACT nasal spray Place 2 sprays into both nostrils daily.   Yes [provider]  Multiple Vitamin (MULTIVITAMIN ADULT PO) Take by mouth.   Yes [provider]  QUEtiapine  (SEROQUEL) 25 MG tablet TAKE 1/2-1 TABLET BY MOUTH AT BEDTIME AS NEEDED FOR INSOMNIA 04/07/20  Yes Tonia Ghent, MD  sertraline (ZOLOFT) 100 MG tablet TAKE 1 TABLET BY MOUTH DAILY 04/07/20  Yes Tonia Ghent, MD    Allergies:      Allergies  Allergen Reactions  . Latex     REACTION: rash    Social History:  reports that she has been smoking e-cigarettes. She has been smoking about 0.00 packs per day for the past 0.00 years. She has never used smokeless tobacco. She reports current alcohol use. She reports that she does not use drugs.   Family History:      Family History  Problem Relation Age of Onset  . Hyperlipidemia Mother   . Breast cancer Mother   . Hypertension Father   . Hyperlipidemia Father   . Memory loss Father   . Colon cancer Maternal Uncle     Review of Systems: Review of Systems  Constitutional: Negative for chills and fever.  HENT: Negative for hearing loss.   Respiratory: Negative for shortness of breath.   Cardiovascular: Negative for chest pain.  Gastrointestinal: Positive for abdominal pain and diarrhea (loose stools). Negative for nausea and vomiting.  Genitourinary: Negative for dysuria.  Musculoskeletal: Negative for myalgias.  Skin: Negative for rash.  Neurological: Negative for dizziness.  Psychiatric/Behavioral: Negative for depression.    Physical Exam BP (!) 141/87   Pulse 96   Temp 98.5 F (36.9 C) (Oral)   Ht _0  (1.676 m)   Wt 238 lb 9.6 oz (108.2 kg)   LMP 04/27/2020   SpO2 97%   BMI 38.51 kg/m  CONSTITUTIONAL: No acute distress HEENT:  Normocephalic, atraumatic, extraocular motion intact. NECK: Trachea is midline, and there is no jugular venous distension.  RESPIRATORY:  Normal respiratory effort without pathologic use of accessory muscles. CARDIOVASCULAR:  Regular rhythm and rate. GI: The abdomen is soft, non-distended, currently non-tender to palpation.  Negative Murphy's sign.  MUSCULOSKELETAL:   Normal muscle strength and tone in all four extremities.  No peripheral edema or cyanosis. SKIN: Skin turgor is normal. There are no pathologic skin lesions.  NEUROLOGIC:  Motor and sensation is grossly normal.  Cranial nerves are grossly intact. PSYCH:  Alert and oriented to person, place and time. Affect is normal.  Laboratory Analysis: Labs from 04/19/20: Na 139, K 4.0, Cl 104, CO2 26, BUN 11, Cr 0.77.  Total bilirubin 0.3, AST 11, ALT 10, Alk Phos 59, Lipase 205.  WBC 8.8, Hgb 13.7, Hct 41, Plt 251.  Imaging: U/S 04/27/20: IMPRESSION: 1. 0.9 cm mobile gallstone in the gallbladder. No gallbladder wall thickening, pericholecystic fluid, or sonographic Murphy's sign.   Assessment and Plan: This is a 50 y.o. female with symptomatic cholelithiasis  --Discussed with the patient her laboratory results as well as her U/S findings.  Her LFTs are normal, but her lipase was mildly elevated.  I do not think this is related to her gallbladder (i.e. gallstone pancreatitis) as her LFTs were all normal.  This may be related to her alcohol intake.  Recommended that she try cutting down on alcohol intake as this may affect her pancreas as well.  Separately, also discussed with her that cutting down on smoking can only help [place] with her surgery and the wound healing process. --From the gallbladder standpoint, I think it's reasonable at this point to proceed with surgery, as conservative measures are not working well anymore.  Discussed with her the role for robotic assisted cholecystectomy and reviewed with her the risks of bleeding, infection, and injury to surrounding structures.  She's willing to proceed.  She understands that she would also need COVID-19 testing prior to surgery. --Based on her schedule, we will schedule her for surgery on 06/10/20.   Olean Ree, MD

## 2020-06-10 NOTE — Anesthesia Procedure Notes (Signed)
Procedure Name: Intubation Performed by: Fletcher-Harrison, Lakita Sahlin, CRNA Pre-anesthesia Checklist: Patient identified, Emergency Drugs available, Suction available and Patient being monitored Patient Re-evaluated:Patient Re-evaluated prior to induction Oxygen Delivery Method: Circle system utilized Preoxygenation: Pre-oxygenation with 100% oxygen Induction Type: IV induction Ventilation: Mask ventilation without difficulty Laryngoscope Size: McGraph and 3 Grade View: Grade I Tube type: Oral Tube size: 7.0 mm Number of attempts: 1 Airway Equipment and Method: Stylet and Oral airway Placement Confirmation: ETT inserted through vocal cords under direct vision,  positive ETCO2,  breath sounds checked- equal and bilateral and CO2 detector Secured at: 22 cm Tube secured with: Tape Dental Injury: Teeth and Oropharynx as per pre-operative assessment        

## 2020-06-10 NOTE — Op Note (Signed)
  Procedure Date:  06/10/2020  Pre-operative Diagnosis:  Symptomatic cholelithiasis  Post-operative Diagnosis:  Symptomatic cholelithiasis  Procedure:  Robotic assisted cholecystectomy with ICG FireFly cholangiogram  Surgeon:  Howie Ill, MD  Assistant:  Gillermo Murdoch, PA-S  Anesthesia:  General endotracheal  Estimated Blood Loss:  10 ml  Specimens:  gallbladder  Complications:  None  Indications for Procedure:  This is a 50 y.o. female who presents with abdominal pain and workup revealing symptomatic cholelithiasis.  The benefits, complications, treatment options, and expected outcomes were discussed with the patient. The risks of bleeding, infection, recurrence of symptoms, failure to resolve symptoms, bile duct damage, bile duct leak, retained common bile duct stone, bowel injury, and need for further procedures were all discussed with the patient and she was willing to proceed.  Description of Procedure: The patient was correctly identified in the preoperative area and brought into the operating room.  The patient was placed supine with VTE prophylaxis in place.  Appropriate time-outs were performed.  Anesthesia was induced and the patient was intubated.  Appropriate antibiotics were infused.  The abdomen was prepped and draped in a sterile fashion. An infraumbilical incision was made. A cutdown technique was used to enter the abdominal cavity without injury, and a 12 mm robotic port was inserted.  Pneumoperitoneum was obtained with appropriate opening pressures.  Three 8-mm ports were placed in the mid abdomen at the level of the umbilicus under direct visualization.  The DaVinci platform was docked, camera targeted, and instruments were placed under direct visualization.  The gallbladder was identified.  The fundus was grasped and retracted cephalad.  Adhesions were lysed bluntly and with electrocautery. The infundibulum was grasped and retracted laterally, exposing the  peritoneum overlying the gallbladder.  This was incised with electrocautery and extended on either side of the gallbladder.  FireFly cholangiogram was then obtained, and we were able to clearly identify the cystic duct and common bile duct.  The cystic duct and cystic artery were carefully dissected with combination of cautery and blunt dissection.  Both were clipped twice proximally and once distally, cutting in between.  The gallbladder was taken from the gallbladder fossa in a retrograde fashion with electrocautery. The gallbladder was placed in an Endocatch bag. The liver bed was inspected and any bleeding was controlled with electrocautery. The right upper quadrant was then inspected again revealing intact clips, no bleeding, and no ductal injury.    The 8 mm ports were removed under direct visualization and the 12 mm port was removed.  The Endocatch bag was brought out via the umbilical incision. The fascial opening was closed using 0 vicryl suture.  Local anesthetic was infused in all incisions and the incisions were closed with 4-0 Monocryl.  The wounds were cleaned and sealed with DermaBond.  The patient was emerged from anesthesia and extubated and brought to the recovery room for further management.  The patient tolerated the procedure well and all counts were correct at the end of the case.   Howie Ill, MD

## 2020-06-10 NOTE — Transfer of Care (Signed)
Immediate Anesthesia Transfer of Care Note  Patient: CASEE KNEPP  Procedure(s) Performed: XI ROBOTIC ASSISTED LAPAROSCOPIC CHOLECYSTECTOMY (N/A ) INDOCYANINE GREEN FLUORESCENCE IMAGING (ICG) (N/A )  Patient Location: PACU  Anesthesia Type:General  Level of Consciousness: drowsy and patient cooperative  Airway & Oxygen Therapy: Patient Spontanous Breathing  Post-op Assessment: Report given to RN and Post -op Vital signs reviewed and stable  Post vital signs: Reviewed and stable  Last Vitals:  Vitals Value Taken Time  BP 129/77 06/10/20 0908  Temp    Pulse 91 06/10/20 0910  Resp 17 06/10/20 0910  SpO2 95 % 06/10/20 0910  Vitals shown include unvalidated device data.  Last Pain:  Vitals:   06/10/20 0621  TempSrc: Temporal  PainSc: 0-No pain         Complications: No complications documented.

## 2020-06-10 NOTE — Anesthesia Postprocedure Evaluation (Signed)
Anesthesia Post Note  Patient: LAINEE LEHRMAN  Procedure(s) Performed: XI ROBOTIC ASSISTED LAPAROSCOPIC CHOLECYSTECTOMY (N/A ) INDOCYANINE GREEN FLUORESCENCE IMAGING (ICG) (N/A )  Patient location during evaluation: PACU Anesthesia Type: General Level of consciousness: awake and alert Pain management: pain level controlled Vital Signs Assessment: post-procedure vital signs reviewed and stable Respiratory status: spontaneous breathing, nonlabored ventilation, respiratory function stable and patient connected to nasal cannula oxygen Cardiovascular status: blood pressure returned to baseline and stable Postop Assessment: no apparent nausea or vomiting Anesthetic complications: no   No complications documented.   Last Vitals:  Vitals:   06/10/20 0936 06/10/20 0946  BP: 127/81 137/80  Pulse: 82 79  Resp: 12 16  Temp: 37 C 36.9 C  SpO2: 94% 98%    Last Pain:  Vitals:   06/10/20 0946  TempSrc: Temporal  PainSc: 4                  Cleda Mccreedy Suttyn Cryder

## 2020-06-10 NOTE — Discharge Instructions (Signed)

## 2020-06-11 LAB — SURGICAL PATHOLOGY

## 2020-06-23 ENCOUNTER — Encounter: Payer: Self-pay | Admitting: Surgery

## 2020-06-23 ENCOUNTER — Other Ambulatory Visit: Payer: Self-pay

## 2020-06-23 ENCOUNTER — Ambulatory Visit (INDEPENDENT_AMBULATORY_CARE_PROVIDER_SITE_OTHER): Payer: BC Managed Care – PPO | Admitting: Surgery

## 2020-06-23 VITALS — BP 146/87 | HR 86 | Temp 98.4°F | Ht 66.0 in | Wt 229.6 lb

## 2020-06-23 DIAGNOSIS — K8020 Calculus of gallbladder without cholecystitis without obstruction: Secondary | ICD-10-CM

## 2020-06-23 DIAGNOSIS — Z09 Encounter for follow-up examination after completed treatment for conditions other than malignant neoplasm: Secondary | ICD-10-CM

## 2020-06-23 NOTE — Patient Instructions (Addendum)
You may use Benadryl ointment to the itchy areas.   You may use Triple ATB ointment to the wound site and cover loosely with a bandage until it heals.   GENERAL POST-OPERATIVE  PATIENT INSTRUCTIONS   WOUND CARE INSTRUCTIONS: Try to keep the wound dry and avoid ointments on the wound unless directed to do so.  If the wound becomes bright red and painful or starts to drain infected material that is not clear, please contact your physician immediately.  If the wound is mildly pink and has a thick firm ridge underneath it, this is normal, and is referred to as a healing ridge.  This will resolve over the next 4-6 weeks.  BATHING: You may shower if you have been informed of this by your surgeon. However, Please do not submerge in a tub, hot tub, or pool until incisions are completely sealed or have been told by your surgeon that you may do so.  DIET:  You may eat any foods that you can tolerate.  It is a good idea to eat a high fiber diet and take in plenty of fluids to prevent constipation.  If you do become constipated you may want to take a mild laxative or take ducolax tablets on a daily basis until your bowel habits are regular.  Constipation can be very uncomfortable, along with straining, after recent surgery.  ACTIVITY: You are encouraged to walk and engage in light activity for the next two weeks.  You should not lift more than 20 pounds for 6 weeks after surgery as it could put you at increased risk for complications.  Twenty pounds is roughly equivalent to a plastic bag of groceries. At that time- Listen to your body when lifting, if you have pain when lifting, stop and then try again in a few days. Soreness after doing exercises or activities of daily living is normal as you get back in to your normal routine.  MEDICATIONS:  Try to take narcotic medications and anti-inflammatory medications, such as tylenol, ibuprofen, naprosyn, etc., with food.  This will minimize stomach upset from the  medication.  Should you develop nausea and vomiting from the pain medication, or develop a rash, please discontinue the medication and contact your physician.  You should not drive, make important decisions, or operate machinery when taking narcotic pain medication.  SUNBLOCK Use sun block to incision area over the next year if this area will be exposed to sun. This helps decrease scarring and will allow you avoid a permanent darkened area over your incision.  QUESTIONS:  Please feel free to call our office if you have any questions, and we will be glad to assist you.

## 2020-06-23 NOTE — Progress Notes (Signed)
06/23/2020  HPI: Maria Lawson is a 50 y.o. female s/p robotic cholecytectomy on 06/10/20.  Patient presents for follow up.  Reports doing well, though with some loose stools and bloatedness that continues to improve.  Reports itchiness of the incisions.  Vital signs: BP (!) 146/87   Pulse 86   Temp 98.4 F (36.9 C)   Ht 5\' 6"  (1.676 m)   Wt 229 lb 9.6 oz (104.1 kg)   LMP 06/18/2020   SpO2 95%   BMI 37.06 kg/m    Physical Exam: Constitutional: No acute distress Abdomen:  Soft, non-distended, non-tender to palpation.  Umbilical incision has small superficial dehiscence with fibrinous material between skin edges.  This was sharply debrided with scissors without issues.  Assessment/Plan: This is a 50 y.o. female s/p robotic cholecystectomy.  --Discussed with the patient that her symptoms are not uncommon after gallbladder surgery.  These should continue to improve, but she should let 52 know if no changes in the next 1-2 months. --Umbilical wound debrided at bedside.  Triple antibiotic and bandaid applied.  Can do this at home once daily.  No antibiotics needed, but she can start using Benaryl ointment for itchiness. --Follow up prn.   [place], MD [facility]

## 2020-06-25 ENCOUNTER — Other Ambulatory Visit: Payer: Self-pay | Admitting: Family Medicine

## 2020-07-01 ENCOUNTER — Ambulatory Visit (INDEPENDENT_AMBULATORY_CARE_PROVIDER_SITE_OTHER): Payer: BC Managed Care – PPO | Admitting: General Surgery

## 2020-07-01 ENCOUNTER — Telehealth: Payer: Self-pay | Admitting: Surgery

## 2020-07-01 ENCOUNTER — Other Ambulatory Visit: Payer: Self-pay

## 2020-07-01 ENCOUNTER — Encounter: Payer: Self-pay | Admitting: General Surgery

## 2020-07-01 VITALS — BP 143/81 | HR 105 | Temp 98.8°F | Ht 66.0 in | Wt 229.8 lb

## 2020-07-01 DIAGNOSIS — Z09 Encounter for follow-up examination after completed treatment for conditions other than malignant neoplasm: Secondary | ICD-10-CM

## 2020-07-01 NOTE — Progress Notes (Signed)
Maria Lawson is here today for a wound check.  She is a 50 year old woman who underwent a robot-assisted laparoscopic cholecystectomy on June 10, 2020.  She was seen by her surgeon, Dr. Aleen Campi, last week.  Her umbilical incision had a small superficial dehiscence with some fibrinous material between the skin edges.  He debrided this sharply with scissors without any issues.  She contacted our office today, concerned that she had pus coming out of her navel.  She has not had any fevers or chills.  No nausea or vomiting.  She has had some diarrhea and bloating after her operation, but had been reassured by Dr. Aleen Campi that this is not uncommon and should improve.  Today's Vitals   07/01/20 1423  BP: (!) 143/81  Pulse: (!) 105  Temp: 98.8 F (37.1 C)  TempSrc: Oral  SpO2: 96%  Weight: 229 lb 12.8 oz (104.2 kg)  Height: 5\' 6"  (1.676 m)   Body mass index is 37.09 kg/m. Focused examination of her umbilical site demonstrates that there is no erythema, induration, or purulent drainage present.  There is fibrin in the wound.  I am unable to express any fluid or material.  I sharply debrided the fibrinous exudate once again with scissors.  She tolerated this well.  I also advised her that if she wanted, she could gently rub the area with a washcloth when she is in the shower.  She was reassured that there was no evidence of infection.  She will return on an as-needed basis.

## 2020-07-01 NOTE — Telephone Encounter (Signed)
Patient is calling and is asking if one of the nurses would give her a call back said she is having yellow pus coming out of her belly button. Please call patient and advise.

## 2020-07-01 NOTE — Patient Instructions (Signed)
Continue using Neosporin as needed. Once the incisions are healed, you may  resume to take a bath.       If you have any concerns or questions, please feel free to call our office.

## 2020-07-01 NOTE — Telephone Encounter (Signed)
Spoke with pt and she is going to be seen today by Dr. Lady Gary.

## 2020-07-01 NOTE — Telephone Encounter (Signed)
LVM for pt to call the office.

## 2020-07-26 ENCOUNTER — Encounter: Payer: Self-pay | Admitting: Surgery

## 2020-07-26 ENCOUNTER — Ambulatory Visit (INDEPENDENT_AMBULATORY_CARE_PROVIDER_SITE_OTHER): Payer: BC Managed Care – PPO | Admitting: Surgery

## 2020-07-26 ENCOUNTER — Telehealth: Payer: Self-pay

## 2020-07-26 ENCOUNTER — Other Ambulatory Visit: Payer: Self-pay

## 2020-07-26 VITALS — BP 142/84 | HR 91 | Temp 98.3°F | Ht 66.0 in | Wt 223.8 lb

## 2020-07-26 DIAGNOSIS — Z09 Encounter for follow-up examination after completed treatment for conditions other than malignant neoplasm: Secondary | ICD-10-CM

## 2020-07-26 DIAGNOSIS — R1011 Right upper quadrant pain: Secondary | ICD-10-CM

## 2020-07-26 NOTE — Progress Notes (Signed)
07/26/2020  HPI: Maria Lawson is a 50 y.o. female s/p robotic assisted cholecystectomy on 06/10/2020 for symptomatic cholelithiasis.  She presents today for follow-up because she still having issues with bloatedness, right-sided discomfort, and liquid stools since her surgery.  She does report that the pain that initially brought her in and from her gallbladder has improved but there are still issues as above.  Denies any fevers, chills, chest pain, shortness of breath.  Denies any dark urine, or changes to her skin color.  Vital signs: BP (!) 142/84   Pulse 91   Temp 98.3 F (36.8 C) (Oral)   Ht 5\' 6"  (1.676 m)   Wt 223 lb 12.8 oz (101.5 kg)   SpO2 97%   BMI 36.12 kg/m    Physical Exam: Constitutional: No acute distress Abdomen: Soft, nondistended, currently nontender to palpation.  Umbilical incision still has a small superficial opening which is very shallow measuring about 5 to 6 mm.  Silver nitrate applied and dressed with dry gauze dressing.  Assessment/Plan: This is a 50 y.o. female s/p robotic assisted cholecystectomy.  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  However I discussed with the patient that of an over abundance of caution, I would want to repeat an ultrasound of her right upper quadrant as well as a set of labs to make sure there is no other abnormalities. - If her ultrasound and labs are all normal, then I will call her with the results and prescribe her cholestyramine.  I discussed with her that this will help bind the bile to decrease the loose stools and also help with her other symptoms.  The patient did express concern about potential issues with her pancreas given that her lipase has been elevated to 205 back in March prior to my first visit with her.  However her lipase normalized on the repeat check later in the same month.  She does not have any history  of pancreatitis or risk factors to support pancreatic insufficiency.  However I did discuss with the patient that if we do start cholestyramine and her symptoms do not improve, then it would be appropriate to refer her to gastroenterology for further evaluation. - Patient is in agreement with this plan and all of her questions have been answered.  I will call her with the results of her ultrasound.   [place], MD [facility]

## 2020-07-26 NOTE — Patient Instructions (Addendum)
Ultrasound scheduled 07/27/2020 @ 9:45 -nothing to eat/drink after midnight. Tonight. Please go to the lab at [facility] after your Ultrasound is done. Dr.Piscoya will call you with the results. Please see your follow up appointment listed below.

## 2020-07-27 ENCOUNTER — Ambulatory Visit
Admission: RE | Admit: 2020-07-27 | Discharge: 2020-07-27 | Disposition: A | Discharge status: Home / Self Care | Payer: BC Managed Care – PPO | Source: Ambulatory Visit | Attending: Surgery | Admitting: Surgery

## 2020-07-27 ENCOUNTER — Other Ambulatory Visit
Admission: RE | Admit: 2020-07-27 | Discharge: 2020-07-27 | Disposition: A | Discharge status: Home / Self Care | Payer: BC Managed Care – PPO | Source: Home / Self Care | Attending: Surgery | Admitting: Surgery

## 2020-07-27 ENCOUNTER — Other Ambulatory Visit: Payer: Self-pay

## 2020-07-27 DIAGNOSIS — R109 Unspecified abdominal pain: Secondary | ICD-10-CM | POA: Diagnosis not present

## 2020-07-27 DIAGNOSIS — E611 Iron deficiency: Secondary | ICD-10-CM

## 2020-07-27 DIAGNOSIS — E538 Deficiency of other specified B group vitamins: Secondary | ICD-10-CM

## 2020-07-27 DIAGNOSIS — R1011 Right upper quadrant pain: Secondary | ICD-10-CM

## 2020-07-27 DIAGNOSIS — E559 Vitamin D deficiency, unspecified: Secondary | ICD-10-CM | POA: Insufficient documentation

## 2020-07-27 LAB — COMPREHENSIVE METABOLIC PANEL
ALT: 14 U/L (ref 0–44)
AST: 15 U/L (ref 15–41)
Albumin: 4.3 g/dL (ref 3.5–5.0)
Alkaline Phosphatase: 57 U/L (ref 38–126)
Anion gap: 6 (ref 5–15)
BUN: 10 mg/dL (ref 6–20)
CO2: 28 mmol/L (ref 22–32)
Calcium: 9 mg/dL (ref 8.9–10.3)
Chloride: 104 mmol/L (ref 98–111)
Creatinine, Ser: 0.73 mg/dL (ref 0.44–1.00)
GFR, Estimated: >60 mL/min (ref >60)
Glucose, Bld: 86 mg/dL (ref 70–99)
Potassium: 4.2 mmol/L (ref 3.5–5.1)
Sodium: 138 mmol/L (ref 135–145)
Total Bilirubin: 0.8 mg/dL (ref 0.3–1.2)
Total Protein: 7 g/dL (ref 6.5–8.1)

## 2020-07-27 LAB — CBC WITH DIFFERENTIAL/PLATELET
Abs Immature Granulocytes: 0.03 10*3/uL (ref 0.00–0.07)
Basophils Absolute: 0.1 10*3/uL (ref 0.0–0.1)
Basophils Relative: 1 %
Eosinophils Absolute: 0.2 10*3/uL (ref 0.0–0.5)
Eosinophils Relative: 3 %
HCT: 44.2 % (ref 36.0–46.0)
Hemoglobin: 14.7 g/dL (ref 12.0–15.0)
Immature Granulocytes: 0 %
Lymphocytes Relative: 22 %
Lymphs Abs: 1.8 10*3/uL (ref 0.7–4.0)
MCH: 29.2 pg (ref 26.0–34.0)
MCHC: 33.3 g/dL (ref 30.0–36.0)
MCV: 87.9 fL (ref 80.0–100.0)
Monocytes Absolute: 0.5 10*3/uL (ref 0.1–1.0)
Monocytes Relative: 6 %
Neutro Abs: 5.4 10*3/uL (ref 1.7–7.7)
Neutrophils Relative %: 68 %
Platelets: 263 10*3/uL (ref 150–400)
RBC: 5.03 MIL/uL (ref 3.87–5.11)
RDW: 13.1 % (ref 11.5–15.5)
WBC: 8 10*3/uL (ref 4.0–10.5)
nRBC: 0 % (ref 0.0–0.2)

## 2020-07-27 LAB — IRON: Iron: 98 ug/dL (ref 28–170)

## 2020-07-27 LAB — VITAMIN D 25 HYDROXY (VIT D DEFICIENCY, FRACTURES): Vit D, 25-Hydroxy: 34.35 ng/mL (ref 30–100)

## 2020-07-27 LAB — VITAMIN B12: Vitamin B-12: 202 pg/mL (ref 180–914)

## 2020-07-27 MED ORDER — CHOLESTYRAMINE 4 G PO PACK: 4.0000 g | PACK | Freq: Every day | ORAL | 1 refills | Status: DC

## 2020-07-27 NOTE — Progress Notes (Signed)
07/27/20:  U/S of RUQ and labs reviewed.  Her U/S shows a small fluid collection in the gallbladder fossa measuring about 2.8 cm, suspected to be a resolving hematoma. Her labs are normal including CBC and CMP, so less likely to be a result of a post-operative bile leak.  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  Will start the patient on Cholestyramine 4g daily and follow up as scheduled.  Maria Lawson

## 2020-07-27 NOTE — Addendum Note (Signed)
Addended by: Myrtie Hawk on: 07/27/2020 05:22 PM   Modules accepted: Orders

## 2020-07-27 NOTE — Progress Notes (Signed)
Error

## 2020-07-28 ENCOUNTER — Other Ambulatory Visit: Payer: Self-pay | Admitting: Family Medicine

## 2020-07-28 DIAGNOSIS — E538 Deficiency of other specified B group vitamins: Secondary | ICD-10-CM

## 2020-07-28 NOTE — Telephone Encounter (Signed)
Error

## 2020-08-19 ENCOUNTER — Other Ambulatory Visit: Payer: Self-pay | Admitting: Surgery

## 2020-08-24 IMAGING — DX DG CERVICAL SPINE COMPLETE 4+V
6 series · 6 of 6 positions shown · non-contrast
Comparison: None.

CLINICAL DATA: Right-sided radiculopathy, no known injury, initial
encounter

EXAM:
CERVICAL SPINE - COMPLETE 4+ VIEW

[c-spine lat]
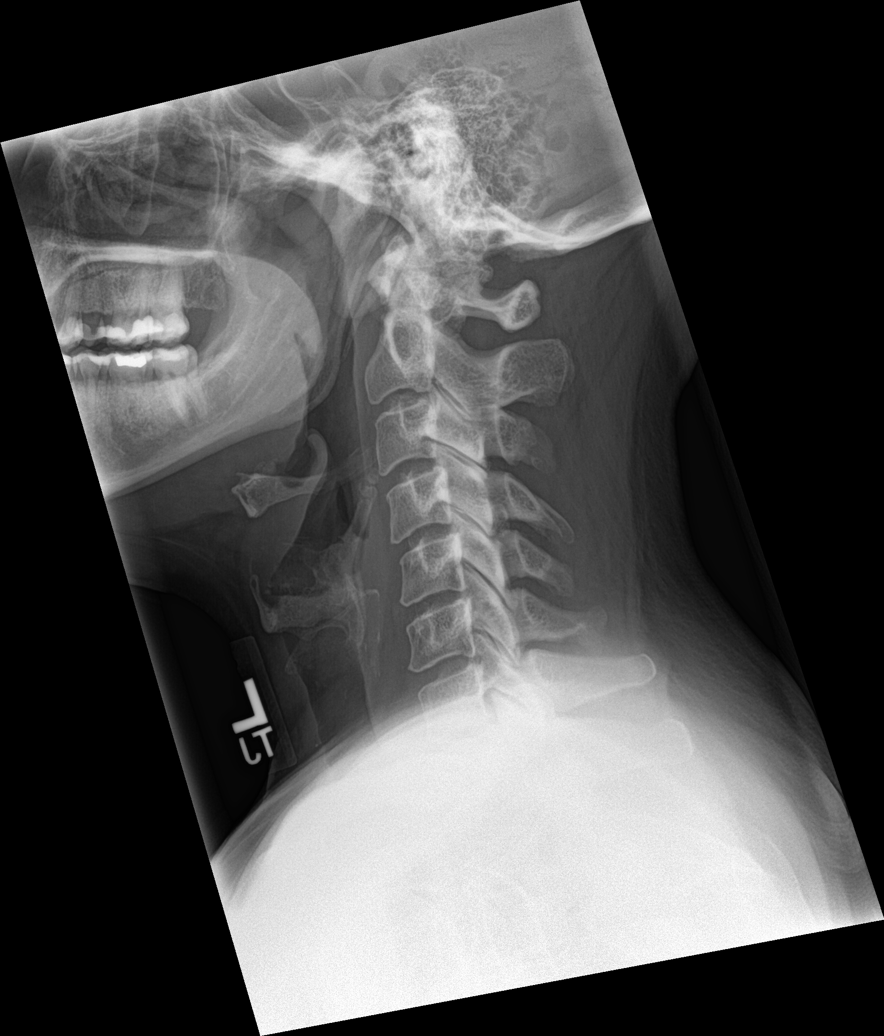

[c-spine obl (1 of 2)]
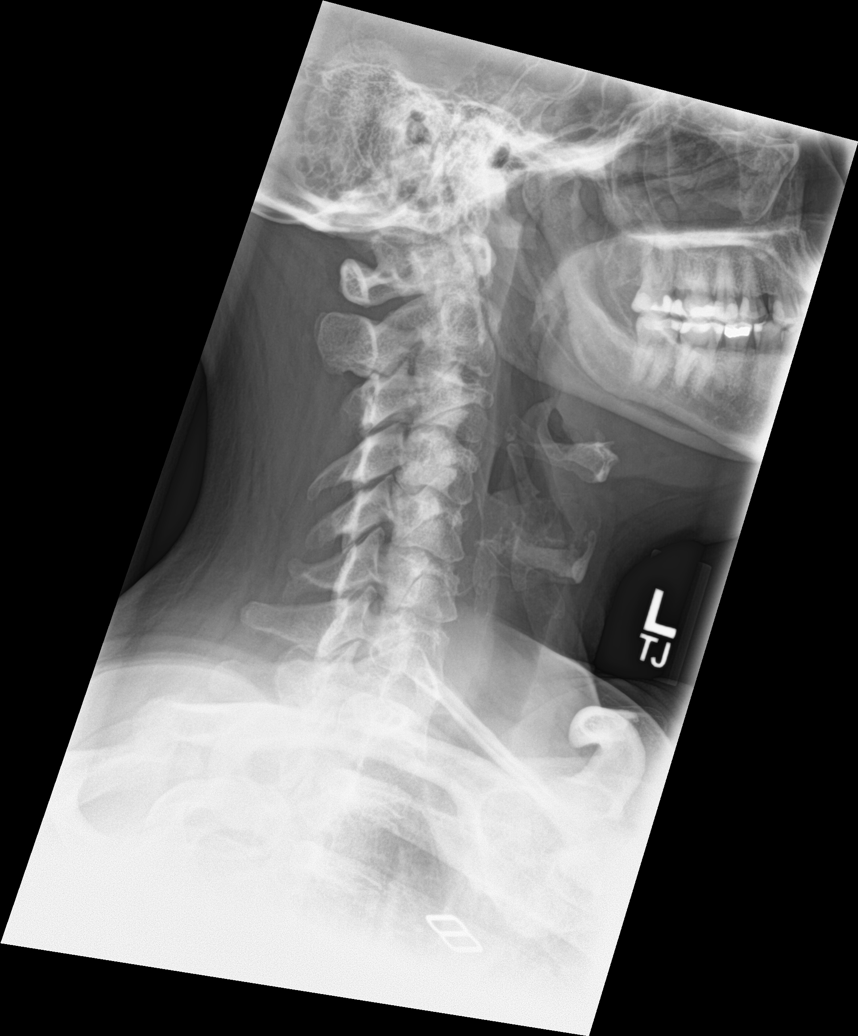

[c-spine obl (2 of 2)]
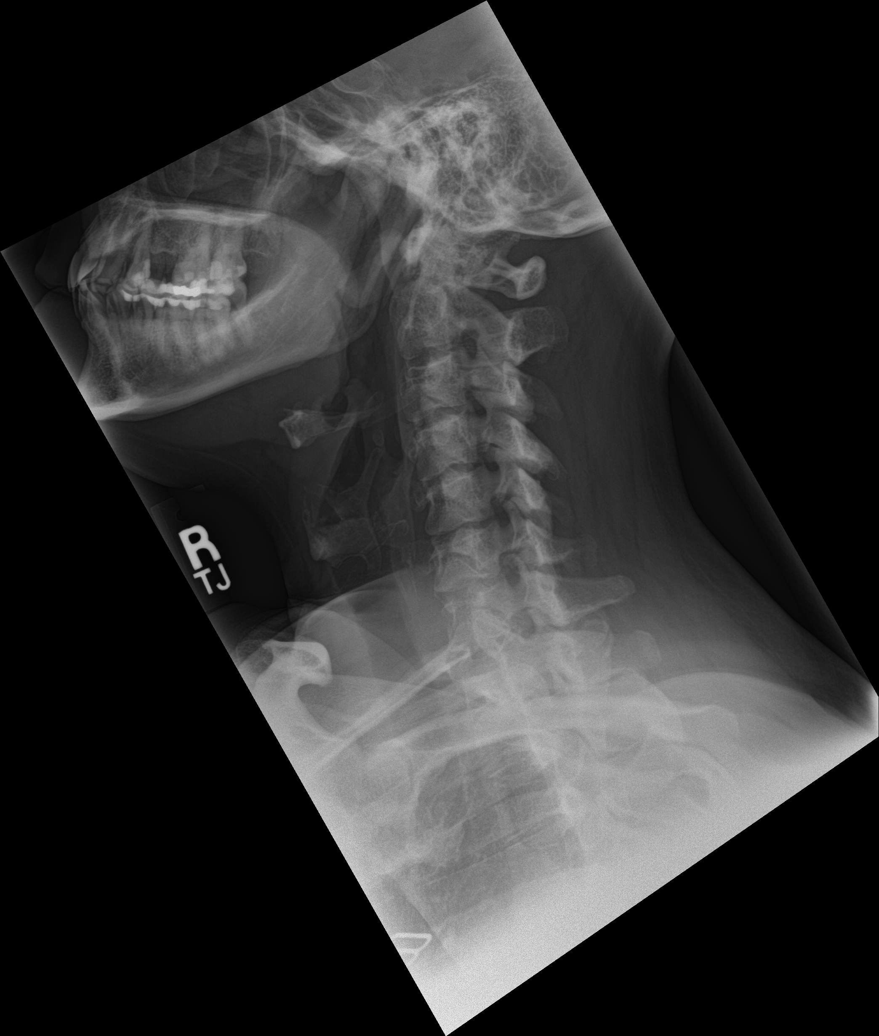

[c-spine ap]
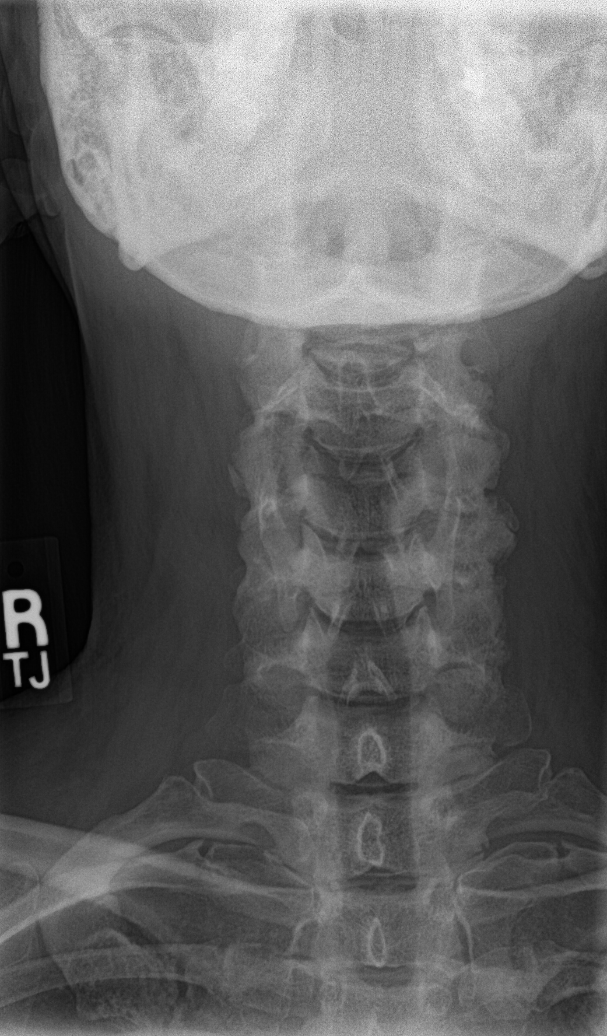

[c-spine open mouth]
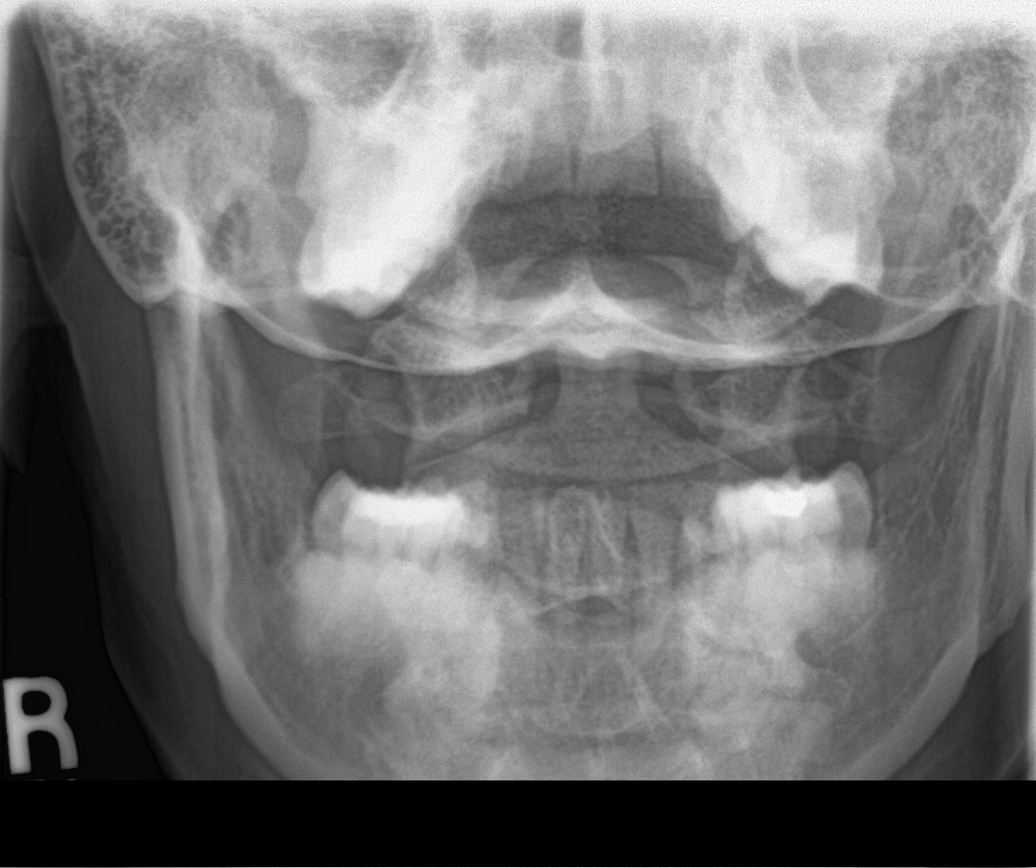

[c-spine swimmers]
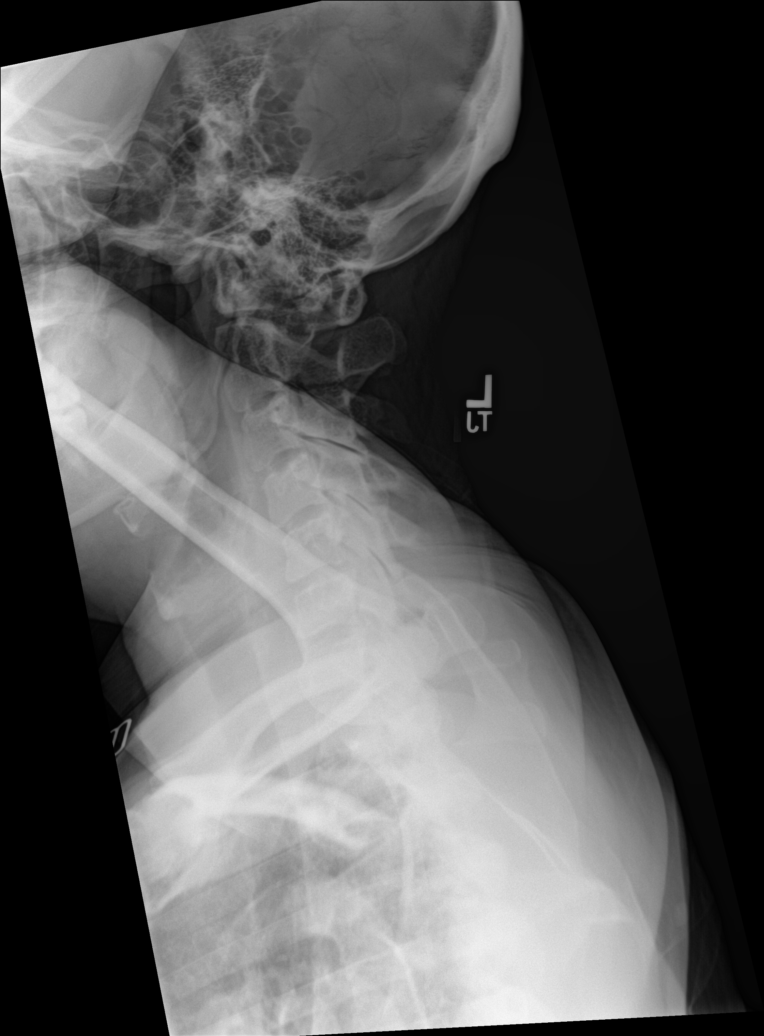

[6 of 6 positions shown; findings below may reference images not displayed]

FINDINGS: Seven cervical segments are well visualized. Vertebral body height
is well maintained. No acute fracture or acute facet abnormality is
noted. Mild neural foraminal narrowing is seen bilaterally. Mild
facet hypertrophic changes are noted. The odontoid is within normal
limits. No soft tissue abnormality is seen.
IMPRESSION: Mild degenerative change without acute abnormality.

## 2020-08-27 ENCOUNTER — Ambulatory Visit: Payer: BC Managed Care – PPO | Admitting: Surgery

## 2020-08-27 ENCOUNTER — Other Ambulatory Visit: Payer: Self-pay

## 2020-08-27 ENCOUNTER — Encounter: Payer: Self-pay | Admitting: Surgery

## 2020-08-27 VITALS — BP 140/90 | HR 89 | Temp 98.3°F | Ht 66.0 in | Wt 223.0 lb

## 2020-08-27 DIAGNOSIS — R197 Diarrhea, unspecified: Secondary | ICD-10-CM

## 2020-08-27 NOTE — Progress Notes (Signed)
08/27/2020  HPI: Maria Lawson is a 50 y.o. female s/p robotic assisted cholecystectomy on 06/10/2020.  She was last seen on 07/26/2020 because she was still having issues with bloatedness, right-sided discomfort, and liquid stools.  Laboratory work-up on 07/27/2020 did not show any abnormalities.  She had an ultrasound of the right upper quadrant on 07/27/2020 which showed only a very small postop fluid collection in the gallbladder fossa measuring 2.8 cm that was likely a liquefied hematoma.  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  The patient reports that when she takes the medication it does help with the loose stools and she reports that the right-sided discomfort has basically resolved now.  However she reports that the medication has a very sandy consistency and is very hard to take.  Vital signs: BP 140/90   Pulse 89   Temp 98.3 F (36.8 C) (Oral)   Ht 5\' 6"  (1.676 m)   Wt 223 lb (101.2 kg)   SpO2 97%   BMI 35.99 kg/m    Physical Exam: Constitutional: No acute distress Abdomen: Soft, nondistended, nontender to palpation.  Her surgical incisions are now well-healed with no evidence of infection.  Assessment/Plan: This is a 50 y.o. 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  - The patient reports that the cholestyramine does help when she takes it but she is not taking it regularly because of the sandy consistency.  The patient reports that although it is helping with her stools, she still notes that the stools are very yellow in color and that has not changed since even before surgery.  I think as a precaution, we will send a referral to the gastroenterology team for further evaluation to see if there are any other reasons for her symptoms.  From a surgical standpoint, the wounds have healed very well and there is no surgical complication at this point based on her labs and imaging  study. - Patient may follow-up with 52 on an as-needed basis.   [place], MD [facility]

## 2020-08-27 NOTE — Patient Instructions (Addendum)
A referral has been placed for [facility] in [place]. They will call you for an appointment.   You may try Vitamin E ointment to help with the scars.    If you have any concerns or questions, please feel free to call our office.   Gallbladder Eating Plan  If you have a gallbladder condition, you may have trouble digesting fats. Eating a low-fat diet can help reduce your symptoms, and may be helpful before and after having surgery to remove your gallbladder (cholecystectomy). Your health care provider may recommend that you work with a diet and nutrition specialist (dietitian) to help you reduce the amount of fat in your diet. What are tips for following this plan? General guidelines Limit your fat intake to less than 30% of your total daily calories. If you eat around 1,800 calories each day, this is less than 60 grams (g) of fat per day. Fat is an important part of a healthy diet. Eating a low-fat diet can make it hard to maintain a healthy body weight. Ask your dietitian how much fat, calories, and other nutrients you need each day. Eat small, frequent meals throughout the day instead of three large meals. Drink at least 8-10 cups of fluid a day. Drink enough fluid to keep your urine clear or pale yellow. Limit alcohol intake to no more than 1 drink a day for nonpregnant women and 2 drinks a day for men. One drink equals 12 oz of beer, 5 oz of wine, or 1 oz of hard liquor. Reading food labels  Check Nutrition Facts on food labels for the amount of fat per serving. Choose foods with less than 3 grams of fat per serving.  Shopping Choose nonfat and low-fat healthy foods. Look for the words "nonfat," "low fat," or "fat free." Avoid buying processed or prepackaged foods. Cooking Cook using low-fat methods, such as baking, broiling, grilling, or boiling. Cook with small amounts of healthy fats, such as olive oil, grapeseed oil, canola oil, or sunflower oil. What foods are  recommended? All fresh, frozen, or canned fruits and vegetables. Whole grains. Low-fat or non-fat (skim) milk and yogurt. Lean meat, skinless poultry, fish, eggs, and beans. Low-fat protein supplement powders or drinks. Spices and herbs. What foods are not recommended? High-fat foods. These include baked goods, fast food, fatty cuts of meat, ice cream, french toast, sweet rolls, pizza, cheese bread, foods covered with butter, creamy sauces, or cheese. Fried foods. These include french fries, tempura, battered fish, breaded chicken, fried breads, and sweets. Foods with strong odors. Foods that cause bloating and gas. Summary A low-fat diet can be helpful if you have a gallbladder condition, or before and after gallbladder surgery. Limit your fat intake to less than 30% of your total daily calories. This is about 60 g of fat if you eat 1,800 calories each day. Eat small, frequent meals throughout the day instead of three large meals. This information is not intended to replace advice given to you by your health care provider. Make sure you discuss any questions you have with your healthcare provider. Document Revised: 09/18/2019 Document Reviewed: 09/18/2019 Elsevier Patient Education  2022 [employer].

## 2020-09-02 DIAGNOSIS — Z20822 Contact with and (suspected) exposure to covid-19: Secondary | ICD-10-CM | POA: Diagnosis not present

## 2020-09-02 DIAGNOSIS — J019 Acute sinusitis, unspecified: Secondary | ICD-10-CM | POA: Diagnosis not present

## 2020-09-26 ENCOUNTER — Other Ambulatory Visit: Payer: Self-pay | Admitting: Family Medicine

## 2020-10-14 ENCOUNTER — Encounter: Payer: Self-pay | Admitting: Gastroenterology

## 2020-10-14 ENCOUNTER — Other Ambulatory Visit: Payer: Self-pay

## 2020-10-14 ENCOUNTER — Ambulatory Visit (INDEPENDENT_AMBULATORY_CARE_PROVIDER_SITE_OTHER): Payer: BC Managed Care – PPO | Admitting: Gastroenterology

## 2020-10-14 VITALS — BP 129/88 | HR 81 | Temp 97.6°F | Ht 66.0 in | Wt 223.6 lb

## 2020-10-14 DIAGNOSIS — K591 Functional diarrhea: Secondary | ICD-10-CM

## 2020-10-14 MED ORDER — COLESEVELAM HCL 625 MG PO TABS: 625.0000 mg | ORAL_TABLET | Freq: Two times a day (BID) | ORAL | 3 refills | Status: DC

## 2020-10-14 MED ORDER — NA SULFATE-K SULFATE-MG SULF 17.5-3.13-1.6 GM/177ML PO SOLN: 1.0000 | Freq: Once | ORAL | 0 refills | Status: AC

## 2020-10-14 NOTE — Progress Notes (Signed)
Gastroenterology Consultation  Referring Provider:     Henrene Dodge, MD Primary Care Physician:  Joaquim Nam, MD Primary Gastroenterologist:  Dr. Servando Snare     Reason for Consultation:     Diarrhea        HPI:   Maria Lawson is a 50 y.o. y/o female referred for consultation & management of diarrhea by Dr. Para March, Dwana Curd, MD. this patient comes in today with a history of diarrhea.  The patient also reports that she was doing better on cholestyramine but could not handle the taste.  She states that she has had diarrhea for a long time and her diarrhea is usually yellow in color which was concerning for her.  The patient denies any unexplained weight loss.  She was seen by surgery with postcholecystectomy syndrome suggested and a follow-up with GI instituted.  The patient reports that despite taking the cholestyramine she still continued to have diarrhea but it was better.  The diarrhea has been present for many years prior to having the gallbladder out but she feels like it is gotten worse recently.  There is no report of any black stools or bloody stools except when she takes iron.  The patient does report that she was found to have iron deficiency once and attributed to her increase bleeding from her menstrual cycle that started when she began going through menopause.  There is no report of any significant abdominal pain.  She does have some right-sided back pain that she thinks may be related to gas when she gets these attacks of diarrhea.  Past Medical History:  Diagnosis Date   Allergy 10/12/1998   Anemia    Anxiety 12/14/2004   Cholelithiasis    Migraine with aura    Panic attack     Past Surgical History:  Procedure Laterality Date   APPENDECTOMY  1987   VAGINAL DELIVERY      Prior to Admission medications   Medication Sig Start Date End Date Taking? Authorizing Provider  acetaminophen (TYLENOL) 500 MG tablet Take 2 tablets (1,000 mg total) by mouth every 6 (six) hours  as needed for mild pain. 06/10/20   Henrene Dodge, MD  cetirizine (ZYRTEC) 10 MG tablet Take 1 tablet (10 mg total) by mouth daily. 04/16/15   Joaquim Nam, MD  Cholecalciferol (VITAMIN D) 50 MCG (2000 UT) CAPS Take 1 capsule (2,000 Units total) by mouth daily. 05/07/20   Joaquim Nam, MD  cholestyramine [name]) 4 g packet Take 1 packet (4 g total) by mouth daily. 07/27/20   Piscoya, Elita Quick, MD  cyanocobalamin (,VITAMIN B-12,) 1000 MCG/ML injection Inject 1 mL (1,000 mcg total) into the muscle every 30 (thirty) days. 05/09/20   Joaquim Nam, MD  fluticasone Aleda Grana) 50 MCG/ACT nasal spray Place 2 sprays into both nostrils daily.    [provider]  Multiple Vitamin (MULTIVITAMIN ADULT PO) Take 1 tablet by mouth daily.    [provider]  QUEtiapine (SEROQUEL) 25 MG tablet TAKE 1/2 TO 1 TABLET BY MOUTH AT BEDTIME AS NEEDED FOR INSOMNIA 09/28/20   Joaquim Nam, MD  sertraline (ZOLOFT) 100 MG tablet TAKE 1 TABLET BY MOUTH EVERY DAY Patient taking differently: Take 100 mg by mouth daily. 05/04/20   Joaquim Nam, MD    Family History  Problem Relation Age of Onset   Hyperlipidemia Mother    Breast cancer Mother    Hypertension Father    Hyperlipidemia Father    Memory loss  Father    Colon cancer Maternal Uncle      Social History   Tobacco Use   Smoking status: Every Day    Packs/day: 0.00    Years: 0.00    Pack years: 0.00    Types: E-cigarettes, Cigarettes   Smokeless tobacco: Never   Tobacco comments:    age 41 quit  Vaping Use   Vaping Use: Some days  Substance Use Topics   Alcohol use: Yes    Alcohol/week: 0.0 standard drinks    Comment: rare   Drug use: Yes    Types: Marijuana    Allergies as of 10/14/2020 - Review Complete 08/27/2020  Allergen Reaction Noted   Latex  02/25/2010    Review of Systems:    All systems reviewed and negative except where noted in HPI.   Physical Exam:  There were no vitals taken for this visit. No LMP  recorded. General:   Alert,  Well-developed, well-nourished, pleasant and cooperative in NAD Head:  Normocephalic and atraumatic. Eyes:  Sclera clear, no icterus.   Conjunctiva pink. Ears:  Normal auditory acuity. Neck:  Supple; no masses or thyromegaly. Lungs:  Respirations even and unlabored.  Clear throughout to auscultation.   No wheezes, crackles, or rhonchi. No acute distress. Heart:  Regular rate and rhythm; no murmurs, clicks, rubs, or gallops. Abdomen:  Normal bowel sounds.  No bruits.  Soft, non-tender and non-distended without masses, hepatosplenomegaly or hernias noted.  No guarding or rebound tenderness.  Negative Carnett sign.   Rectal:  Deferred.  Pulses:  Normal pulses noted. Extremities:  No clubbing or edema.  No cyanosis. Neurologic:  Alert and oriented x3;  grossly normal neurologically. Skin:  Intact without significant lesions or rashes.  No jaundice. Lymph Nodes:  No significant cervical adenopathy. Psych:  Alert and cooperative. Normal mood and affect.  Imaging Studies: No results found.  Assessment and Plan:   Maria Lawson is a 50 y.o. y/o female comes in today with a history of diarrhea for some time prior to having her gallbladder out that has gotten worse.  The patient has not tolerated cholestyramine so she will be started on WelChol.  The patient will also be set up for colonoscopy due to her age.  The patient's diarrhea is likely caused by irritable bowel syndrome which has been exacerbated by having her gallbladder out.  The patient also has been told that fiber can help solidify her stools and make the diarrhea less bothersome.  The patient will follow up at the time of colonoscopy.  The patient has been explained the plan and agrees with it.    Midge Minium, MD. Clementeen Graham    Note: This dictation was prepared with Dragon dictation along with smaller phrase technology. Any transcriptional errors that result from this process are unintentional.

## 2020-10-14 NOTE — Addendum Note (Signed)
Addended by: Roena Malady on: 10/14/2020 04:13 PM   Modules accepted: Orders, SmartSet

## 2020-10-21 ENCOUNTER — Encounter: Payer: Self-pay | Admitting: Gastroenterology

## 2020-10-22 ENCOUNTER — Encounter: Payer: Self-pay | Admitting: Anesthesiology

## 2020-11-12 ENCOUNTER — Encounter: Payer: Self-pay | Admitting: General Surgery

## 2020-11-25 ENCOUNTER — Other Ambulatory Visit: Payer: Self-pay | Admitting: Family Medicine

## 2020-12-14 ENCOUNTER — Telehealth (INDEPENDENT_AMBULATORY_CARE_PROVIDER_SITE_OTHER): Payer: BC Managed Care – PPO | Admitting: Nurse Practitioner

## 2020-12-14 ENCOUNTER — Other Ambulatory Visit: Payer: Self-pay

## 2020-12-14 VITALS — HR 74 | Temp 97.3°F

## 2020-12-14 DIAGNOSIS — R0609 Other forms of dyspnea: Secondary | ICD-10-CM | POA: Insufficient documentation

## 2020-12-14 DIAGNOSIS — U071 COVID-19: Secondary | ICD-10-CM

## 2020-12-14 MED ORDER — PREDNISONE 20 MG PO TABS: 20.0000 mg | ORAL_TABLET | Freq: Every day | ORAL | 0 refills | Status: DC

## 2020-12-14 NOTE — Assessment & Plan Note (Addendum)
Discussed current treatment options in regard to COVID-19.  Did discuss both oral antivirals with patient.  Discussed that there are still EUA.  Patient politely declined using antiviral medication.  Did discuss with patient about timeline and if she wants to use that she is to start today.  She acknowledged understood.  She states that she has a sinus infection.  2 patient with a diagnosis of COVID-19 after offer appropriate medications for that which do not include antibiotics.  Patient knowledge understood we will send in some prednisone to help with inflammation and pressure in face along with the DOE. Did discuss with patient to keep monitoring her symptoms reach out to me via MyChart by Friday we can reevaluate treatment plan.  Also gave signs and symptoms when patient needs to be seen urgently or emergently.  Patient knowledge understood.  Continue to monitor

## 2020-12-14 NOTE — Progress Notes (Signed)
Patient ID: Maria Lawson, female    DOB: 1971-01-21, 50 y.o.   MRN: [id number]  Virtual visit completed through caregility, a video enabled telemedicine application. Due to national recommendations of social distancing due to COVID-19, a virtual visit is felt to be most appropriate for this patient at this time. Reviewed limitations, risks, security and privacy concerns of performing a virtual visit and the availability of in person appointments. I also reviewed that there may be a patient responsible charge related to this service. The patient agreed to proceed.   Patient location: home Provider location: [place] at [facility], office Persons participating in this virtual visit: patient, provider   If any vitals were documented, they were collected by patient at home unless specified below.    Pulse 74   Temp (!) 97.3 F (36.3 C)   SpO2 96%    CC: Covid 19/ Sinus issues Subjective:   HPI: Maria Lawson is a 50 y.o. female presenting on 12/14/2020 for Covid Positive (On 12/09/20, sx started on 12/09/20-fever-103, body aches, GI upset, sore throat, cough, nasal congestion, chest congestion., sinus pressure/pain. Mainly concerned with sinus issues-feels like she has sinus infection. )   Symptoms started on 12/09/2020 At home test same day that was positive Not vaccinated Taking tylenol and ibuprofen Extended release mucinex. Albuterol inhaler once approx 2 nights ago. Using vaporizer OTC medications have helped some.     Relevant past medical, surgical, family and social history reviewed and updated as indicated. Interim medical history since our last visit reviewed. Allergies and medications reviewed and updated. Outpatient Medications Prior to Visit  Medication Sig Dispense Refill   acetaminophen (TYLENOL) 500 MG tablet Take 2 tablets (1,000 mg total) by mouth every 6 (six) hours as needed for mild pain.     cetirizine (ZYRTEC) 10 MG tablet Take 1 tablet (10 mg total)  by mouth daily.     Cholecalciferol (VITAMIN D) 50 MCG (2000 UT) CAPS Take 1 capsule (2,000 Units total) by mouth daily.     cyanocobalamin (,VITAMIN B-12,) 1000 MCG/ML injection Inject 1 mL (1,000 mcg total) into the muscle every 30 (thirty) days. 1 mL 12   Ferrous Sulfate (IRON PO) Take by mouth.     fluticasone (FLONASE) 50 MCG/ACT nasal spray Place 2 sprays into both nostrils daily.     Multiple Vitamin (MULTIVITAMIN ADULT PO) Take 1 tablet by mouth daily.     QUEtiapine (SEROQUEL) 25 MG tablet TAKE 1/2 TO 1 TABLET BY MOUTH AT BEDTIME AS NEEDED FOR INSOMNIA 90 tablet 0   sertraline (ZOLOFT) 100 MG tablet Take 1 tablet (100 mg total) by mouth daily. 90 tablet 1   colesevelam (WELCHOL) 625 MG tablet Take 1 tablet (625 mg total) by mouth 2 (two) times daily with a meal. (Patient not taking: Reported on 12/14/2020) 60 tablet 3   cholestyramine (QUESTRAN) 4 g packet Take 1 packet (4 g total) by mouth daily. (Patient not taking: Reported on 10/21/2020) 30 each 1   No facility-administered medications prior to visit.     Per HPI unless specifically indicated in ROS section below Review of Systems  Constitutional:  Positive for fatigue. Negative for chills and fever.  HENT:  Positive for congestion, ear pain (left ear cloggs with nose blowing) and sinus pressure. Negative for postnasal drip and sore throat.   Respiratory:  Positive for cough (yellow) and shortness of breath (DOE).   Cardiovascular:  Negative for chest pain.  Gastrointestinal:  Positive for diarrhea and  nausea. Negative for abdominal pain, constipation and vomiting.  Musculoskeletal:  Positive for myalgias.  Neurological:  Positive for headaches.  Objective:  Pulse 74   Temp (!) 97.3 F (36.3 C)   SpO2 96%   Wt Readings from Last 3 Encounters:  10/14/20 223 lb 9.6 oz (101.4 kg)  08/27/20 223 lb (101.2 kg)  07/26/20 223 lb 12.8 oz (101.5 kg)      Physical exam: Gen: alert, NAD, not ill appearing Pulm: speaks in complete  sentences without increased work of breathing Psych: normal mood, normal thought content      Results for orders placed or performed during the hospital encounter of 07/27/20  CBC with Differential/Platelet  Result Value Ref Range   WBC 8.0 4.0 - 10.5 K/uL   RBC 5.03 3.87 - 5.11 MIL/uL   Hemoglobin 14.7 12.0 - 15.0 g/dL   HCT 28.3 15.1 - 76.1 %   MCV 87.9 80.0 - 100.0 fL   MCH 29.2 26.0 - 34.0 pg   MCHC 33.3 30.0 - 36.0 g/dL   RDW 60.7 37.1 - 06.2 %   Platelets 263 150 - 400 K/uL   nRBC 0.0 0.0 - 0.2 %   Neutrophils Relative % 68 %   Neutro Abs 5.4 1.7 - 7.7 K/uL   Lymphocytes Relative 22 %   Lymphs Abs 1.8 0.7 - 4.0 K/uL   Monocytes Relative 6 %   Monocytes Absolute 0.5 0.1 - 1.0 K/uL   Eosinophils Relative 3 %   Eosinophils Absolute 0.2 0.0 - 0.5 K/uL   Basophils Relative 1 %   Basophils Absolute 0.1 0.0 - 0.1 K/uL   Immature Granulocytes 0 %   Abs Immature Granulocytes 0.03 0.00 - 0.07 K/uL  Iron  Result Value Ref Range   Iron 98 28 - 170 ug/dL  VITAMIN D 25 Hydroxy (Vit-D Deficiency, Fractures)  Result Value Ref Range   Vit D, 25-Hydroxy 34.35 30 - 100 ng/mL  Vitamin B12  Result Value Ref Range   Vitamin B-12 202 180 - 914 pg/mL  Comprehensive metabolic panel  Result Value Ref Range   Sodium 138 135 - 145 mmol/L   Potassium 4.2 3.5 - 5.1 mmol/L   Chloride 104 98 - 111 mmol/L   CO2 28 22 - 32 mmol/L   Glucose, Bld 86 70 - 99 mg/dL   BUN 10 6 - 20 mg/dL   Creatinine, Ser 6.94 0.44 - 1.00 mg/dL   Calcium 9.0 8.9 - 85.4 mg/dL   Total Protein 7.0 6.5 - 8.1 g/dL   Albumin 4.3 3.5 - 5.0 g/dL   AST 15 15 - 41 U/L   ALT 14 0 - 44 U/L   Alkaline Phosphatase 57 38 - 126 U/L   Total Bilirubin 0.8 0.3 - 1.2 mg/dL   GFR, Estimated >62 >70 mL/min   Anion gap 6 5 - 15   Assessment & Plan:   Problem List Items Addressed This Visit       Other   COVID-19 - Primary    Discussed current treatment options in regard to COVID-19.  Did discuss both oral antivirals with  patient.  Discussed that there are still EUA.  Patient politely declined using antiviral medication.  Did discuss with patient about timeline and if she wants to use that she is to start today.  She acknowledged understood.  She states that she has a sinus infection.  2 patient with a diagnosis of COVID-19 after offer appropriate medications for that which do not include  antibiotics.  Patient knowledge understood we will send in some prednisone to help with inflammation and pressure in face along with the DOE. Did discuss with patient to keep monitoring her symptoms reach out to me via MyChart by Friday we can reevaluate treatment plan.  Also gave signs and symptoms when patient needs to be seen urgently or emergently.  Patient knowledge understood.  Continue to monitor      DOE (dyspnea on exertion)   Relevant Medications   predniSONE (DELTASONE) 20 MG tablet     No orders of the defined types were placed in this encounter.  No orders of the defined types were placed in this encounter.   I discussed the assessment and treatment plan with the patient. The patient was provided an opportunity to ask questions and all were answered. The patient agreed with the plan and demonstrated an understanding of the instructions. The patient was advised to call back or seek an in-person evaluation if the symptoms worsen or if the condition fails to improve as anticipated.  Follow up plan: No follow-ups on file.  Audria Nine, NP

## 2020-12-16 ENCOUNTER — Encounter: Payer: Self-pay | Admitting: Nurse Practitioner

## 2020-12-17 ENCOUNTER — Other Ambulatory Visit: Payer: Self-pay | Admitting: Nurse Practitioner

## 2020-12-17 DIAGNOSIS — J019 Acute sinusitis, unspecified: Secondary | ICD-10-CM

## 2020-12-17 DIAGNOSIS — R0609 Other forms of dyspnea: Secondary | ICD-10-CM

## 2020-12-17 MED ORDER — AMOXICILLIN-POT CLAVULANATE 875-125 MG PO TABS: 1.0000 | ORAL_TABLET | Freq: Two times a day (BID) | ORAL | 0 refills | Status: AC

## 2020-12-17 NOTE — Progress Notes (Signed)
Patient reached out stating that her covid symptoms have improved but still dealing with sinus issues. With history and length of symptoms will treat for superimposed sinusitis

## 2020-12-31 ENCOUNTER — Other Ambulatory Visit: Payer: Self-pay | Admitting: Family Medicine

## 2021-01-03 ENCOUNTER — Ambulatory Visit
Admission: RE | Admit: 2021-01-03 | Payer: BC Managed Care – PPO | Source: Home / Self Care | Admitting: Gastroenterology

## 2021-01-03 SURGERY — COLONOSCOPY WITH PROPOFOL
Anesthesia: Choice

## 2021-05-31 ENCOUNTER — Encounter: Payer: Self-pay | Admitting: Family

## 2021-05-31 ENCOUNTER — Ambulatory Visit (INDEPENDENT_AMBULATORY_CARE_PROVIDER_SITE_OTHER): Payer: BC Managed Care – PPO | Admitting: Family

## 2021-05-31 VITALS — BP 148/68 | HR 83 | Temp 98.7°F | Resp 16 | Ht 66.0 in | Wt 222.4 lb

## 2021-05-31 DIAGNOSIS — Z1231 Encounter for screening mammogram for malignant neoplasm of breast: Secondary | ICD-10-CM | POA: Insufficient documentation

## 2021-05-31 DIAGNOSIS — N951 Menopausal and female climacteric states: Secondary | ICD-10-CM | POA: Diagnosis not present

## 2021-05-31 DIAGNOSIS — N926 Irregular menstruation, unspecified: Secondary | ICD-10-CM | POA: Insufficient documentation

## 2021-05-31 DIAGNOSIS — D509 Iron deficiency anemia, unspecified: Secondary | ICD-10-CM

## 2021-05-31 DIAGNOSIS — E538 Deficiency of other specified B group vitamins: Secondary | ICD-10-CM

## 2021-05-31 DIAGNOSIS — F411 Generalized anxiety disorder: Secondary | ICD-10-CM

## 2021-05-31 DIAGNOSIS — Z803 Family history of malignant neoplasm of breast: Secondary | ICD-10-CM

## 2021-05-31 LAB — CBC WITH DIFFERENTIAL/PLATELET
Basophils Absolute: 0 10*3/uL (ref 0.0–0.1)
Basophils Relative: 0.5 % (ref 0.0–3.0)
Eosinophils Absolute: 0.1 10*3/uL (ref 0.0–0.7)
Eosinophils Relative: 1.6 % (ref 0.0–5.0)
HCT: 42.9 % (ref 36.0–46.0)
Hemoglobin: 14.4 g/dL (ref 12.0–15.0)
Lymphocytes Relative: 18.5 % (ref 12.0–46.0)
Lymphs Abs: 1.5 10*3/uL (ref 0.7–4.0)
MCHC: 33.7 g/dL (ref 30.0–36.0)
MCV: 87.7 fl (ref 78.0–100.0)
Monocytes Absolute: 0.5 10*3/uL (ref 0.1–1.0)
Monocytes Relative: 6 % (ref 3.0–12.0)
Neutro Abs: 6 10*3/uL (ref 1.4–7.7)
Neutrophils Relative %: 73.4 % (ref 43.0–77.0)
Platelets: 230 10*3/uL (ref 150.0–400.0)
RBC: 4.89 Mil/uL (ref 3.87–5.11)
RDW: 13.4 % (ref 11.5–15.5)
WBC: 8.2 10*3/uL (ref 4.0–10.5)

## 2021-05-31 LAB — IBC + FERRITIN
Ferritin: 10.6 ng/mL (ref 10.0–291.0)
Iron: 61 ug/dL (ref 42–145)
Saturation Ratios: 15.2 % — ABNORMAL LOW (ref 20.0–50.0)
TIBC: 401.8 ug/dL (ref 250.0–450.0)
Transferrin: 287 mg/dL (ref 212.0–360.0)

## 2021-05-31 LAB — FOLLICLE STIMULATING HORMONE: FSH: 2.5 m[IU]/mL

## 2021-05-31 LAB — TSH: TSH: 1.9 u[IU]/mL (ref 0.35–5.50)

## 2021-05-31 LAB — B12 AND FOLATE PANEL
Folate: 18.1 ng/mL (ref >5.9)
Vitamin B-12: 176 pg/mL — ABNORMAL LOW (ref 211–911)

## 2021-05-31 NOTE — Assessment & Plan Note (Signed)
Continue iron with menses ?Ordering total iron ferritin and trans today pending results ?

## 2021-05-31 NOTE — Assessment & Plan Note (Signed)
Suspected perimenopause ?Ordering tsh fsh and prolactin to r/o  ?Discussed possible agents, likely only possibility would be brisdelle however being on zoloft will try to increase zoloft instead to see if help with symptoms ?Hormones not great option as pt with breast cancer risk in family ?

## 2021-05-31 NOTE — Progress Notes (Signed)
? ?Established Patient Office Visit ? ?Subjective:  ?Patient ID: Maria Lawson, female    DOB: [birth year]  Age: 51 y.o. MRN: [id number] ? ?CC:  ?Chief Complaint  ?Patient presents with  ? Anxiety  ? Hot Flashes  ? ? ?HPI ?Maria Lawson is here today with concerns.  ?She thinks she may be starting menopause.  ?She is starting to wake up in the middle of the night in a panic and otherwise waking up hot in the middle of the night as well. Occasional hot flashes throughout the day. Seems to be with changing skin as well, dry in the day and waking up feeling oily at night. Does note increased vaginal dryness as well.  ?Menses have become abnormally heavy, more than normal and starting to become more scatter. Used to be every 26 days not 30-45 days, unprepared.  ? ?Not night sweats.  ?No recent weight changes.  ?Some increased fatigue. ? ?IDA: takes iron only when having periods.  ?B12: not coming for her injections currently, was going to pharmacy that became hard to make appts for. Does not take anything otc.  ? ?Sister just diagnosed with hodgkins lymphoma, about to do treatment.  ?Mom did have breast cancer in her 63's.  ? ?Anxiety and stress has increased a little bit as well which she feels is contributing. She does not talk to a therapist but will consider looking into this. No longer taking seroquel every night as it makes it hard for her to get up in the am, only takes a few times a week.  ? ?Past Medical History:  ?Diagnosis Date  ? Allergy 10/12/1998  ? Anemia   ? Anxiety 12/14/2004  ? Cholelithiasis   ? Migraine with aura   ? Panic attack   ? ? ?Past Surgical History:  ?Procedure Laterality Date  ? APPENDECTOMY  1987  ? VAGINAL DELIVERY    ? ? ?Family History  ?Problem Relation Age of Onset  ? Hyperlipidemia Mother   ? Breast cancer Mother   ? Hypertension Father   ? Hyperlipidemia Father   ? Memory loss Father   ? Colon cancer Maternal Uncle   ? ? ?Social History  ? ?Socioeconomic History  ? Marital  status: Divorced  ?  Spouse name: Not on file  ? Number of children: 3  ? Years of education: Not on file  ? Highest education level: Not on file  ?Occupational History  ? Occupation: Former Office manager  ?  Employer: UNEMPLOYED  ?Tobacco Use  ? Smoking status: Every Day  ?  Packs/day: 1.00  ?  Years: 20.00  ?  Pack years: 20.00  ?  Types: E-cigarettes, Cigarettes  ? Smokeless tobacco: Never  ? Tobacco comments:  ?  Cig smoke off and on since age 70.  Estimates 10 yrs NOT smoking during that time.(10/2020)  ?Vaping Use  ? Vaping Use: Some days  ?Substance and Sexual Activity  ? Alcohol use: Yes  ?  Alcohol/week: 6.0 standard drinks  ?  Types: 6 Standard drinks or equivalent per week  ? Drug use: Yes  ?  Types: Marijuana  ? Sexual activity: Not on file  ?Other Topics Concern  ? Not on file  ?Social History Narrative  ? Divorced.   ? 3 kids  ? ?Social Determinants of Health  ? ?Financial Resource Strain: Not on file  ?Food Insecurity: Not on file  ?Transportation Needs: Not on file  ?Physical Activity: Not on file  ?Stress: Not  on file  ?Social Connections: Not on file  ?Intimate Partner Violence: Not on file  ? ? ?Outpatient Medications Prior to Visit  ?Medication Sig Dispense Refill  ? acetaminophen (TYLENOL) 500 MG tablet Take 2 tablets (1,000 mg total) by mouth every 6 (six) hours as needed for mild pain.    ? cetirizine (ZYRTEC) 10 MG tablet Take 1 tablet (10 mg total) by mouth daily.    ? Cholecalciferol (VITAMIN D) 50 MCG (2000 UT) CAPS Take 1 capsule (2,000 Units total) by mouth daily.    ? cyanocobalamin (,VITAMIN B-12,) 1000 MCG/ML injection Inject 1 mL (1,000 mcg total) into the muscle every 30 (thirty) days. 1 mL 12  ? Ferrous Sulfate (IRON PO) Take by mouth.    ? fluticasone (FLONASE) 50 MCG/ACT nasal spray Place 2 sprays into both nostrils daily.    ? Multiple Vitamin (MULTIVITAMIN ADULT PO) Take 1 tablet by mouth daily.    ? QUEtiapine (SEROQUEL) 25 MG tablet TAKE 1/2 TO 1 TABLET BY MOUTH AT BEDTIME  AS NEEDED FOR INSOMNIA 90 tablet 0  ? sertraline (ZOLOFT) 100 MG tablet Take 100 mg by mouth See admin instructions. Take one and 1/2 tablets daily    ? sertraline (ZOLOFT) 100 MG tablet Take 1 tablet (100 mg total) by mouth daily. 90 tablet 1  ? colesevelam (WELCHOL) 625 MG tablet Take 1 tablet (625 mg total) by mouth 2 (two) times daily with a meal. (Patient not taking: Reported on 12/14/2020) 60 tablet 3  ? predniSONE (DELTASONE) 20 MG tablet Take 1 tablet (20 mg total) by mouth daily with breakfast. Avoid NSAID use with medication (Patient not taking: Reported on 05/31/2021) 5 tablet 0  ? ?No facility-administered medications prior to visit.  ? ? ?Allergies  ?Allergen Reactions  ? Cashew Nut (Anacardium Occidentale) Skin Test   ?  Tongue felt thick  ? Latex   ?  REACTION: rash (from tape used during epidural)  ? ? ?ROS ?Review of Systems  ?Constitutional:  Positive for fatigue. Negative for chills, fever and unexpected weight change.  ?Respiratory:  Negative for cough and shortness of breath.   ?Cardiovascular:  Negative for chest pain and leg swelling.  ?Gastrointestinal:  Negative for diarrhea and nausea.  ?Endocrine: Positive for heat intolerance (hot flashes some in day worse at night).  ?     No night sweats ?  ?Genitourinary:  Positive for menstrual problem (irregular periods, becoming heavier.). Negative for difficulty urinating, vaginal bleeding, vaginal discharge and vaginal pain (vaginal dryness increasing).  ?Skin:   ?     Skin changes from oily to dry   ?Psychiatric/Behavioral:  Negative for agitation and sleep disturbance. The patient is nervous/anxious (increased anxiety and stress, at times waking up at night in a panic).   ?All other systems reviewed and are negative. ? ?  ?Objective:  ?  ?Physical Exam ?Vitals reviewed.  ?Constitutional:   ?   General: She is not in acute distress. ?   Appearance: Normal appearance. She is obese. She is not ill-appearing, toxic-appearing or diaphoretic.  ?HENT:  ?    Right Ear: Tympanic membrane normal.  ?   Left Ear: Tympanic membrane normal.  ?   Mouth/Throat:  ?   Mouth: Mucous membranes are moist.  ?   Pharynx: No pharyngeal swelling.  ?   Tonsils: No tonsillar exudate.  ?Eyes:  ?   Extraocular Movements: Extraocular movements intact.  ?   Conjunctiva/sclera: Conjunctivae normal.  ?   Pupils: Pupils are  equal, round, and reactive to light.  ?Neck:  ?   Thyroid: No thyroid mass.  ?Cardiovascular:  ?   Rate and Rhythm: Normal rate and regular rhythm.  ?Pulmonary:  ?   Effort: Pulmonary effort is normal.  ?Musculoskeletal:     ?   General: Normal range of motion.  ?Lymphadenopathy:  ?   Cervical:  ?   Right cervical: No superficial cervical adenopathy. ?   Left cervical: No superficial cervical adenopathy.  ?Skin: ?   General: Skin is warm.  ?   Capillary Refill: Capillary refill takes less than 2 seconds.  ?Neurological:  ?   General: No focal deficit present.  ?   Mental Status: She is alert and oriented to person, place, and time.  ?Psychiatric:     ?   Mood and Affect: Mood normal.     ?   Behavior: Behavior normal.     ?   Thought Content: Thought content normal.     ?   Judgment: Judgment normal.  ? ? ?BP (!) 148/68   Pulse 83   Temp 98.7 ?F (37.1 ?C)   Resp 16   Ht 5\' 6"  (1.676 m)   Wt 222 lb 6 oz (100.9 kg)   LMP 04/30/2021 (Approximate)   SpO2 97%   BMI 35.89 kg/m?  ?Wt Readings from Last 3 Encounters:  ?05/31/21 222 lb 6 oz (100.9 kg)  ?10/14/20 223 lb 9.6 oz (101.4 kg)  ?08/27/20 223 lb (101.2 kg)  ? ? ? ?Health Maintenance Due  ?Topic Date Due  ? Hepatitis C Screening  Never done  ? COLONOSCOPY (Pts 45-70yrs Insurance coverage will need to be confirmed)  Never done  ? PAP SMEAR-Modifier  02/08/2021  ? ? ?There are no preventive care reminders to display for this patient. ? ?Lab Results  ?Component Value Date  ? TSH 1.46 05/03/2020  ? ?Lab Results  ?Component Value Date  ? WBC 8.0 07/27/2020  ? HGB 14.7 07/27/2020  ? HCT 44.2 07/27/2020  ? MCV 87.9  07/27/2020  ? PLT 263 07/27/2020  ? ?Lab Results  ?Component Value Date  ? NA 138 07/27/2020  ? K 4.2 07/27/2020  ? CO2 28 07/27/2020  ? GLUCOSE 86 07/27/2020  ? BUN 10 07/27/2020  ? CREATININE 0.73 07/27/2020  ? BILIT

## 2021-05-31 NOTE — Assessment & Plan Note (Signed)
Mammogram ordered. Pending results. 

## 2021-05-31 NOTE — Patient Instructions (Addendum)
Take daily vitamin B12 1000 mcg once daily.  ?Continue with daily vitamin D ? ?Increase sertraline to 150 mg daily (take 1 and 1/2 tablet) if no improvement or change in the next few weeks increase to 200 mg daily ( 2 100 mg tablets at a time) ? ?Call [facility] [facility] to schedule your mammogram as I have sent the electronic order to their facility.  ?Here is their number : [phone]  ? ?Stop by the lab prior to leaving today. I will notify you of your results once received.  ? ?Due to recent changes in healthcare laws, you may see results of your imaging and/or laboratory studies on MyChart before I have had a chance to review them.  I understand that in some cases there may be results that are confusing or concerning to you. Please understand that not all results are received at the same time and often I may need to interpret multiple results in order to provide you with the best plan of care or course of treatment. Therefore, I ask that you please give me 2 business days to thoroughly review all your results before contacting my office for clarification. Should we see a critical lab result, you will be contacted sooner.  ? ?It was a pleasure seeing you today! Please do not hesitate to reach out with any questions and or concerns. ? ?Regards,  ? ?Jaleesa Cervi ?FNP-C ? ?

## 2021-05-31 NOTE — Assessment & Plan Note (Signed)
Increase zofoft to 150 mg daily, did suggest she go straight to 200 mg however she is is hesitant. Will increase to 1 and 1/2 tablets daily for total of 150 mg . If in two weeks no real improvement increase to 200 mg daily.  ?Work on anxiety reducing techniques.  ?Suggest psychology, handout given to pt. ?

## 2021-05-31 NOTE — Assessment & Plan Note (Addendum)
Ordering b12 pending results  ?Recommend starting 1000 mcg once daily otc ?

## 2021-05-31 NOTE — Assessment & Plan Note (Signed)
Mammogram ordered today, stressed to pt need for routine screenings as high risk ?

## 2021-06-01 LAB — PROLACTIN: Prolactin: 10.3 ng/mL

## 2021-06-01 LAB — HCG, SERUM, QUALITATIVE: Preg, Serum: NEGATIVE

## 2021-06-01 NOTE — Progress Notes (Signed)
Vitamin B12 very low, please set up for B12 injections, 1000 mcg IM once monthly for three months, also schedule 3 month f/u appt to repeat labs and f/u in office. Recommend also oral otc 1000 mcg once daily vitamin B12 ? ?Take daily iron if not already taking. ?Prolactin (hormone) normal  ?Not pregnant ?Thyroid ok ?FSH states mid peak cycle of menses so hard to say if perimenopausal however with sx this is likely. ?No anemia. ?

## 2021-06-02 ENCOUNTER — Ambulatory Visit (INDEPENDENT_AMBULATORY_CARE_PROVIDER_SITE_OTHER): Payer: BC Managed Care – PPO

## 2021-06-02 ENCOUNTER — Telehealth: Payer: Self-pay | Admitting: Family Medicine

## 2021-06-02 DIAGNOSIS — E538 Deficiency of other specified B group vitamins: Secondary | ICD-10-CM

## 2021-06-02 MED ORDER — CYANOCOBALAMIN 1000 MCG/ML IJ SOLN
1000.0000 ug | Freq: Once | INTRAMUSCULAR | Status: AC
  Administered 2021-06-02: 1000 ug via INTRAMUSCULAR

## 2021-06-02 NOTE — Progress Notes (Signed)
Per orders of Dr. Tower, injection of vit B12 given by Palmyra Rogacki. Patient tolerated injection well.  

## 2021-07-06 ENCOUNTER — Ambulatory Visit (INDEPENDENT_AMBULATORY_CARE_PROVIDER_SITE_OTHER): Payer: BC Managed Care – PPO

## 2021-07-06 DIAGNOSIS — E538 Deficiency of other specified B group vitamins: Secondary | ICD-10-CM

## 2021-07-06 MED ORDER — CYANOCOBALAMIN 1000 MCG/ML IJ SOLN
1000.0000 ug | Freq: Once | INTRAMUSCULAR | Status: AC
  Administered 2021-07-06: 1000 ug via INTRAMUSCULAR

## 2021-07-06 NOTE — Progress Notes (Signed)
Per orders of Mayra Reel, in absence of [address], 2nd of 3rd monthly b12 injection given by Erby Pian. Patient tolerated injection well.

## 2021-07-14 ENCOUNTER — Other Ambulatory Visit: Payer: Self-pay | Admitting: Family Medicine

## 2021-07-15 ENCOUNTER — Telehealth: Payer: Self-pay | Admitting: Family Medicine

## 2021-07-15 ENCOUNTER — Encounter: Payer: Self-pay | Admitting: Family Medicine

## 2021-07-15 ENCOUNTER — Ambulatory Visit (INDEPENDENT_AMBULATORY_CARE_PROVIDER_SITE_OTHER): Payer: BC Managed Care – PPO | Admitting: Family Medicine

## 2021-07-15 VITALS — BP 162/70 | HR 87 | Temp 98.1°F | Ht 66.0 in | Wt 220.0 lb

## 2021-07-15 DIAGNOSIS — G5601 Carpal tunnel syndrome, right upper limb: Secondary | ICD-10-CM | POA: Diagnosis not present

## 2021-07-15 DIAGNOSIS — R202 Paresthesia of skin: Secondary | ICD-10-CM | POA: Diagnosis not present

## 2021-07-15 NOTE — Patient Instructions (Addendum)
I presume that you have peripheral nerve irritation with carpal tunnel symptoms.   Use a brace in the meantime.  Okay to sleep in it.   We'll call about seeing hand/ortho clinic.  Take care.  Glad to see you.

## 2021-07-15 NOTE — Progress Notes (Signed)
Her son's hamster just died, condolences offered.  That likely affected her BP today.  D/w pt. she can update me if persistently elevated after clinic.  R arm pain.  Going on for years.  Initially with numb R hand in the AMs, walking up with that.  Going on for ~9 years . Now with pain in the hand, spiderweb feeling.  Can have burning pain near the R deltoid but it may not connect.  Hand sx are interdigital and radial side of the thumb, not as much on the palm or back of hand.  She has positional sx with writing.  R handed.  No similar L handed sx.  R hand grip is now weaker- gradual onset and variable.  Grip may be worse in the AM.  Normal L grip.  No similar leg sx.  No FCNAVD.  No bracing or splinting.    Meds, vitals, and allergies reviewed.   ROS: Per HPI unless specifically indicated in ROS section   Nad Ncat Neck supple no LA rrr R gip pinch weak compared to left Paresthesia but not anesthesia R hand with light touch and monofilament.   R wrist tinel positive.   Normal sensation and negative Tinel testing on the left wrist/hand. Normal capillary refill bilaterally. Normal sensation in the legs.

## 2021-07-17 DIAGNOSIS — R202 Paresthesia of skin: Secondary | ICD-10-CM | POA: Insufficient documentation

## 2021-07-17 NOTE — Assessment & Plan Note (Signed)
Discussed that she could have another concurrent issue like irritation of a nerve root in her neck but  I presume that she has peripheral nerve irritation with carpal tunnel symptoms.   Use a brace in the meantime.  Okay to sleep in it.  I showed the patient what kind of brace to get.  She can get that over-the-counter. We'll call about seeing hand/ortho clinic.  Update me as needed.

## 2021-08-10 ENCOUNTER — Ambulatory Visit: Payer: BC Managed Care – PPO

## 2021-08-11 ENCOUNTER — Encounter: Payer: Self-pay | Admitting: Nurse Practitioner

## 2021-08-11 ENCOUNTER — Ambulatory Visit (INDEPENDENT_AMBULATORY_CARE_PROVIDER_SITE_OTHER): Payer: BC Managed Care – PPO | Admitting: Nurse Practitioner

## 2021-08-11 VITALS — BP 126/80 | HR 103 | Temp 97.5°F | Resp 18 | Wt 215.2 lb

## 2021-08-11 DIAGNOSIS — G4489 Other headache syndrome: Secondary | ICD-10-CM | POA: Diagnosis not present

## 2021-08-11 DIAGNOSIS — R112 Nausea with vomiting, unspecified: Secondary | ICD-10-CM | POA: Insufficient documentation

## 2021-08-11 DIAGNOSIS — K529 Noninfective gastroenteritis and colitis, unspecified: Secondary | ICD-10-CM | POA: Diagnosis not present

## 2021-08-11 MED ORDER — ONDANSETRON 4 MG PO TBDP: 4.0000 mg | ORAL_TABLET | Freq: Three times a day (TID) | ORAL | 0 refills | Status: DC | PRN

## 2021-08-11 NOTE — Assessment & Plan Note (Signed)
Continue doing slow steps of fluid and advance diet as tolerated starting with brat diet did write ondansetron 4 mg 3 times daily as needed nausea vomiting

## 2021-08-11 NOTE — Patient Instructions (Signed)
Nice to see you today I will be in touch with the labs Sent in nausea medication to your pharmacy Follow up if no improvement

## 2021-08-11 NOTE — Assessment & Plan Note (Signed)
Given ex-husband and kid had similar symptoms and it spontaneously resolved Patient has.  We will do symptomatic treatment check BMP to make sure no acute kidney injury

## 2021-08-11 NOTE — Assessment & Plan Note (Signed)
Likely secondary to dehydration.  Did encourage gentle hydration and eating food as tolerated.  Patient continue taking over-the-counter Tylenol as needed

## 2021-08-11 NOTE — Progress Notes (Signed)
Acute Office Visit  Subjective:     Patient ID: Maria Lawson, female    DOB: [birth year], 51 y.o.   MRN: [id number]  Chief Complaint  Patient presents with   Emesis    Started last night-08/10/21, diarrhea, headache, feeling malaise.      Patient is in today for emesis  Symptoms started around 630 last night. States that the whole night with vomiting and diarrhea. Bad sweats that would wake her up. States she has not vomited since this morning but still feels like she has to use the bathroom.  Kept kept down little fluid today (Gatorade). No food. Has taken tyelnol for the headache. Helped some.  LMP: approx 18 days ago.  States that her ex-husband and kid both had the stomach bug.  States ex-husband and kids both have recovered  Review of Systems  Constitutional:  Positive for chills, fever and malaise/fatigue.  Gastrointestinal:  Positive for abdominal pain (cramping has been improving), diarrhea, nausea and vomiting. Negative for blood in stool.  Neurological:  Positive for dizziness and headaches.        Objective:    BP 126/80   Pulse (!) 103   Temp (!) 97.5 F (36.4 C)   Resp 18   Wt 215 lb 4 oz (97.6 kg)   LMP 10/11/2020 (Approximate) Comment: Irregular  SpO2 96%   BMI 34.74 kg/m    Physical Exam Constitutional:      Appearance: Normal appearance.  Cardiovascular:     Rate and Rhythm: Regular rhythm. Tachycardia present.     Heart sounds: Normal heart sounds.  Pulmonary:     Breath sounds: Normal breath sounds.  Abdominal:     General: Bowel sounds are normal. There is no distension.     Palpations: There is no mass.     Tenderness: There is generalized abdominal tenderness.     Hernia: No hernia is present.  Neurological:     Mental Status: She is alert.     No results found for any visits on 08/11/21.      Assessment & Plan:   Problem List Items Addressed This Visit       Digestive   Gastroenteritis    Given ex-husband and kid  had similar symptoms and it spontaneously resolved Patient has.  We will do symptomatic treatment check BMP to make sure no acute kidney injury      Relevant Orders   Basic metabolic panel   Nausea and vomiting - Primary    Continue doing slow steps of fluid and advance diet as tolerated starting with brat diet did write ondansetron 4 mg 3 times daily as needed nausea vomiting      Relevant Medications   ondansetron (ZOFRAN-ODT) 4 MG disintegrating tablet   Other Relevant Orders   Basic metabolic panel     Other   Other headache syndrome    Likely secondary to dehydration.  Did encourage gentle hydration and eating food as tolerated.  Patient continue taking over-the-counter Tylenol as needed       Meds ordered this encounter  Medications   ondansetron (ZOFRAN-ODT) 4 MG disintegrating tablet    Sig: Take 1 tablet (4 mg total) by mouth every 8 (eight) hours as needed for nausea or vomiting.    Dispense:  20 tablet    Refill:  0    Order Specific Question:   Supervising Provider    Answer:   Roxy Manns A [[id number]]    Return if symptoms worsen  or fail to improve.  Romilda Garret, NP

## 2021-08-12 LAB — BASIC METABOLIC PANEL
BUN: 9 mg/dL (ref 6–23)
CO2: 26 mEq/L (ref 19–32)
Calcium: 9.2 mg/dL (ref 8.4–10.5)
Chloride: 101 mEq/L (ref 96–112)
Creatinine, Ser: 0.73 mg/dL (ref 0.40–1.20)
GFR: 96.68 mL/min (ref >60.00)
Glucose, Bld: 100 mg/dL — ABNORMAL HIGH (ref 70–99)
Potassium: 3.7 mEq/L (ref 3.5–5.1)
Sodium: 137 mEq/L (ref 135–145)

## 2021-08-26 NOTE — Telephone Encounter (Signed)
error 

## 2021-09-05 ENCOUNTER — Telehealth: Payer: Self-pay | Admitting: Family Medicine

## 2021-09-05 ENCOUNTER — Encounter: Payer: Self-pay | Admitting: Family Medicine

## 2021-09-05 ENCOUNTER — Ambulatory Visit (INDEPENDENT_AMBULATORY_CARE_PROVIDER_SITE_OTHER): Payer: BC Managed Care – PPO | Admitting: Family Medicine

## 2021-09-05 VITALS — BP 138/78 | HR 83 | Temp 97.7°F | Ht 66.0 in | Wt 218.0 lb

## 2021-09-05 DIAGNOSIS — E538 Deficiency of other specified B group vitamins: Secondary | ICD-10-CM

## 2021-09-05 LAB — VITAMIN B12: Vitamin B-12: 209 pg/mL — ABNORMAL LOW (ref 211–911)

## 2021-09-05 MED ORDER — CYANOCOBALAMIN 1000 MCG/ML IJ SOLN
1000.0000 ug | Freq: Once | INTRAMUSCULAR | Status: AC
  Administered 2021-09-05: 1000 ug via INTRAMUSCULAR

## 2021-09-05 MED ORDER — CYANOCOBALAMIN 1000 MCG/ML IJ SOLN: INTRAMUSCULAR | 1 refills | Status: DC

## 2021-09-05 MED ORDER — BD LUER-LOK SYRINGE 25G X 1" 3 ML MISC: 1 refills | Status: DC

## 2021-09-05 NOTE — Patient Instructions (Addendum)
Ask the front to update your address.  Check a B12 level today and then get a dose today.   Take care.  Glad to see you. Ask for your next B12 dose to be a teaching visit with a nurse.

## 2021-09-05 NOTE — Progress Notes (Unsigned)
Smoking cessation d/w pt.  She is considering quitting and d/w pt.    B12 def d/w pt.  Still fatigued.  She has been moving and that could contribute to the fatigue.  D/w pt about home dosing B12.  She felt better after prev dose, less so after the 2nd dose.  D/w pt about dosing twice a month.    B12 dose given today after getting a trough level.  Meds, vitals, and allergies reviewed.   ROS: Per HPI unless specifically indicated in ROS section   GEN: nad, alert and oriented HEENT: ncat NECK: supple w/o LA CV: rrr.  PULM: ctab, no inc wob ABD: soft, +bs EXT: no edema SKIN: Well-perfused.

## 2021-09-05 NOTE — Telephone Encounter (Signed)
Patient was told to schedule a b12 teaching appoinment with you. Could you please call patient to schedule. I wasn't sure of your availability to schedule with her.

## 2021-09-07 ENCOUNTER — Other Ambulatory Visit: Payer: Self-pay | Admitting: Family Medicine

## 2021-09-07 DIAGNOSIS — E538 Deficiency of other specified B group vitamins: Secondary | ICD-10-CM

## 2021-09-07 NOTE — Assessment & Plan Note (Signed)
Discussed home dose of B12 every 2 weeks. B12 dose given today after getting a trough level. She can set up a nurse visit for teaching here.

## 2021-09-22 ENCOUNTER — Other Ambulatory Visit (INDEPENDENT_AMBULATORY_CARE_PROVIDER_SITE_OTHER): Payer: BC Managed Care – PPO

## 2021-09-22 ENCOUNTER — Ambulatory Visit (INDEPENDENT_AMBULATORY_CARE_PROVIDER_SITE_OTHER): Payer: BC Managed Care – PPO

## 2021-09-22 DIAGNOSIS — E538 Deficiency of other specified B group vitamins: Secondary | ICD-10-CM

## 2021-09-22 LAB — VITAMIN B12: Vitamin B-12: 299 pg/mL (ref 211–911)

## 2021-09-22 MED ORDER — CYANOCOBALAMIN 1000 MCG/ML IJ SOLN
1000.0000 ug | Freq: Once | INTRAMUSCULAR | Status: AC
  Administered 2021-09-22: 1000 ug via INTRAMUSCULAR

## 2021-09-22 NOTE — Progress Notes (Signed)
Education for self injection given at apt. Demonstration provided for pt and then pt returned demonstration of teaching. Pt had difficulty actually performing the injection due to anxiety but was able to perform with assistance. Pt injected B12 into L Quadricep. Provided pt with written material for intramuscular injections. Answered all questions. Advised if any further questions to contact office. Pt verbalized understanding.   Per orders of Vernona Rieger NP, in Dr. Lianne Bushy absence, injection of B12 given in Left Quadricep given by Sherrie George and pt. Patient tolerated injection well.  Campbell Lerner, CMA, observed injection teaching.

## 2021-09-25 ENCOUNTER — Other Ambulatory Visit: Payer: Self-pay | Admitting: Family Medicine

## 2021-09-25 DIAGNOSIS — E538 Deficiency of other specified B group vitamins: Secondary | ICD-10-CM

## 2021-11-28 ENCOUNTER — Ambulatory Visit: Payer: BC Managed Care – PPO | Admitting: Family Medicine

## 2021-12-12 DIAGNOSIS — F419 Anxiety disorder, unspecified: Secondary | ICD-10-CM | POA: Diagnosis not present

## 2021-12-16 DIAGNOSIS — F419 Anxiety disorder, unspecified: Secondary | ICD-10-CM | POA: Diagnosis not present

## 2022-01-12 DIAGNOSIS — F419 Anxiety disorder, unspecified: Secondary | ICD-10-CM | POA: Diagnosis not present

## 2022-01-12 DIAGNOSIS — Z20822 Contact with and (suspected) exposure to covid-19: Secondary | ICD-10-CM | POA: Diagnosis not present

## 2022-01-12 DIAGNOSIS — J029 Acute pharyngitis, unspecified: Secondary | ICD-10-CM | POA: Diagnosis not present

## 2022-01-12 DIAGNOSIS — J069 Acute upper respiratory infection, unspecified: Secondary | ICD-10-CM | POA: Diagnosis not present

## 2022-02-12 ENCOUNTER — Telehealth: Payer: BC Managed Care – PPO | Admitting: Nurse Practitioner

## 2022-02-12 DIAGNOSIS — J069 Acute upper respiratory infection, unspecified: Secondary | ICD-10-CM | POA: Diagnosis not present

## 2022-02-12 MED ORDER — PREDNISONE 20 MG PO TABS: 40.0000 mg | ORAL_TABLET | Freq: Every day | ORAL | 0 refills | Status: AC

## 2022-02-12 MED ORDER — PROMETHAZINE-DM 6.25-15 MG/5ML PO SYRP: 5.0000 mL | ORAL_SOLUTION | Freq: Four times a day (QID) | ORAL | 0 refills | Status: DC | PRN

## 2022-02-12 NOTE — Progress Notes (Signed)
Virtual Visit Consent   Maria Lawson, you are scheduled for a virtual visit with a [facility] provider today. Just as with appointments in the office, your consent must be obtained to participate. Your consent will be active for this visit and any virtual visit you may have with one of our providers in the next 365 days. If you have a MyChart account, a copy of this consent can be sent to you electronically.  As this is a virtual visit, video technology does not allow for your provider to perform a traditional examination. This may limit your provider's ability to fully assess your condition. If your provider identifies any concerns that need to be evaluated in person or the need to arrange testing (such as labs, EKG, etc.), we will make arrangements to do so. Although advances in technology are sophisticated, we cannot ensure that it will always work on either your end or our end. If the connection with a video visit is poor, the visit may have to be switched to a telephone visit. With either a video or telephone visit, we are not always able to ensure that we have a secure connection.  By engaging in this virtual visit, you consent to the provision of healthcare and authorize for your insurance to be billed (if applicable) for the services provided during this visit. Depending on your insurance coverage, you may receive a charge related to this service.  I need to obtain your verbal consent now. Are you willing to proceed with your visit today? Maria Lawson has provided verbal consent on 02/12/2022 for a virtual visit (video or telephone). Claiborne Rigg, NP  Date: 02/12/2022 3:40 PM  Virtual Visit via Video Note   I, Claiborne Rigg, connected with  Maria Lawson  ([id number], 11-25-1970) on 02/12/22 at  3:15 PM EST by a video-enabled telemedicine application and verified that I am speaking with the correct person using two identifiers.  Location: Patient: Virtual Visit Location  Patient: Home Provider: Virtual Visit Location Provider: Home Office   I discussed the limitations of evaluation and management by telemedicine and the availability of in person appointments. The patient expressed understanding and agreed to proceed.    History of Present Illness: Maria Lawson is a 51 y.o. who identifies as a female who was assigned female at birth, and is being seen today for URI with cough and congestion.  Ms Galdamez has been experiencing the following symptoms for the past 5 days: fever tmax 101.1, myalgias, nausea, persistent cough, waxing and waning low O2 sats from 88-94% on RA. She is a smoker and is currently using albuterol inhaler and guafenesin along with tylenol and motrin for fever. She does not have a home covid test and has not been tested for influenza or COVID although the time from for antiviral has lapsed for both.  Problems:  Patient Active Problem List   Diagnosis Date Noted   Other headache syndrome 08/11/2021   Gastroenteritis 08/11/2021   Nausea and vomiting 08/11/2021   Paresthesia 07/17/2021   Irregular menses 05/31/2021   Family history of breast cancer in mother 05/31/2021   Screening mammogram for breast cancer 05/31/2021   Iron deficiency anemia 05/31/2021   Menopausal syndrome (hot flashes) 05/31/2021   Vitamin B12 deficiency 05/31/2021   Leg pain 07/03/2018   Gastroesophageal reflux disease 03/24/2016   Symptomatic cholelithiasis 03/10/2016   RUQ discomfort 02/24/2016   Elevated BP without diagnosis of hypertension 02/02/2015   Mixed emotional features as  adjustment reaction 01/28/2014   Anxiety state 05/06/2006    Allergies:  Allergies  Allergen Reactions   Cashew Nut (Anacardium Occidentale) Skin Test     Tongue felt thick   Latex     REACTION: rash (from tape used during epidural)   Medications:  Current Outpatient Medications:    predniSONE (DELTASONE) 20 MG tablet, Take 2 tablets (40 mg total) by mouth daily with  breakfast for 5 days., Disp: 10 tablet, Rfl: 0   promethazine-dextromethorphan (PROMETHAZINE-DM) 6.25-15 MG/5ML syrup, Take 5 mLs by mouth 4 (four) times daily as needed for cough., Disp: 240 mL, Rfl: 0   acetaminophen (TYLENOL) 500 MG tablet, Take 2 tablets (1,000 mg total) by mouth every 6 (six) hours as needed for mild pain., Disp: , Rfl:    cetirizine (ZYRTEC) 10 MG tablet, Take 1 tablet (10 mg total) by mouth daily., Disp: , Rfl:    Cholecalciferol (VITAMIN D) 50 MCG (2000 UT) CAPS, Take 1 capsule (2,000 Units total) by mouth daily., Disp: , Rfl:    cyanocobalamin (,VITAMIN B-12,) 1000 MCG/ML injection, 104mcg injected IM every 14 days., Disp: 6 mL, Rfl: 1   Ferrous Sulfate (IRON PO), Take by mouth., Disp: , Rfl:    fluticasone (FLONASE) 50 MCG/ACT nasal spray, Place 2 sprays into both nostrils daily., Disp: , Rfl:    Multiple Vitamin (MULTIVITAMIN ADULT PO), Take 1 tablet by mouth daily., Disp: , Rfl:    ondansetron (ZOFRAN-ODT) 4 MG disintegrating tablet, Take 1 tablet (4 mg total) by mouth every 8 (eight) hours as needed for nausea or vomiting., Disp: 20 tablet, Rfl: 0   QUEtiapine (SEROQUEL) 25 MG tablet, TAKE 1/2 TO 1 TABLET BY MOUTH AT BEDTIME AS NEEDED FOR INSOMNIA, Disp: 90 tablet, Rfl: 0   sertraline (ZOLOFT) 100 MG tablet, TAKE 1 TABLET BY MOUTH EVERY DAY, Disp: 90 tablet, Rfl: 1   SYRINGE-NEEDLE, DISP, 3 ML (B-D 3CC LUER-LOK SYR 25GX1") 25G X 1" 3 ML MISC, Use with B12 shots, Disp: 10 each, Rfl: 1  Observations/Objective: Patient is well-developed, well-nourished in no acute distress.  Resting comfortably  at home.  Head is normocephalic, atraumatic.  No labored breathing.  Speech is clear and coherent with logical content.  Patient is alert and oriented at baseline.    Assessment and Plan: 1. Viral URI with cough - promethazine-dextromethorphan (PROMETHAZINE-DM) 6.25-15 MG/5ML syrup; Take 5 mLs by mouth 4 (four) times daily as needed for cough.  Dispense: 240 mL; Refill:  0 - predniSONE (DELTASONE) 20 MG tablet; Take 2 tablets (40 mg total) by mouth daily with breakfast for 5 days.  Dispense: 10 tablet; Refill: 0  INSTRUCTIONS: use a humidifier for nasal congestion Drink plenty of fluids, rest and wash hands frequently to avoid the spread of infection Alternate tylenol and Motrin for relief of fever   Follow Up Instructions: I discussed the assessment and treatment plan with the patient. The patient was provided an opportunity to ask questions and all were answered. The patient agreed with the plan and demonstrated an understanding of the instructions.  A copy of instructions were sent to the patient via MyChart unless otherwise noted below.     The patient was advised to call back or seek an in-person evaluation if the symptoms worsen or if the condition fails to improve as anticipated.  Time:  I spent 11 minutes with the patient via telehealth technology discussing the above problems/concerns.    Gildardo Pounds, NP

## 2022-02-12 NOTE — Patient Instructions (Signed)
Maria Lawson, thank you for joining Claiborne Rigg, NP for today's virtual visit.  While this provider is not your primary care provider (PCP), if your PCP is located in our provider database this encounter information will be shared with them immediately following your visit.   A [facility] MyChart account gives you access to today's visit and all your visits, tests, and labs performed at [facility] " click here if you don't have a [facility] MyChart account or go to [email or web address]  Consent: (Patient) Maria Lawson provided verbal consent for this virtual visit at the beginning of the encounter.  Current Medications:  Current Outpatient Medications:    predniSONE (DELTASONE) 20 MG tablet, Take 2 tablets (40 mg total) by mouth daily with breakfast for 5 days., Disp: 10 tablet, Rfl: 0   promethazine-dextromethorphan (PROMETHAZINE-DM) 6.25-15 MG/5ML syrup, Take 5 mLs by mouth 4 (four) times daily as needed for cough., Disp: 240 mL, Rfl: 0   acetaminophen (TYLENOL) 500 MG tablet, Take 2 tablets (1,000 mg total) by mouth every 6 (six) hours as needed for mild pain., Disp: , Rfl:    cetirizine (ZYRTEC) 10 MG tablet, Take 1 tablet (10 mg total) by mouth daily., Disp: , Rfl:    Cholecalciferol (VITAMIN D) 50 MCG (2000 UT) CAPS, Take 1 capsule (2,000 Units total) by mouth daily., Disp: , Rfl:    cyanocobalamin (,VITAMIN B-12,) 1000 MCG/ML injection, injected IM every 14 days., Disp: 6 mL, Rfl: 1   Ferrous Sulfate (IRON PO), Take by mouth., Disp: , Rfl:    fluticasone (FLONASE) 50 MCG/ACT nasal spray, Place 2 sprays into both nostrils daily., Disp: , Rfl:    Multiple Vitamin (MULTIVITAMIN ADULT PO), Take 1 tablet by mouth daily., Disp: , Rfl:    ondansetron (ZOFRAN-ODT) 4 MG disintegrating tablet, Take 1 tablet (4 mg total) by mouth every 8 (eight) hours as needed for nausea or vomiting., Disp: 20 tablet, Rfl: 0   QUEtiapine (SEROQUEL) 25 MG tablet, TAKE 1/2 TO  1 TABLET BY MOUTH AT BEDTIME AS NEEDED FOR INSOMNIA, Disp: 90 tablet, Rfl: 0   sertraline (ZOLOFT) 100 MG tablet, TAKE 1 TABLET BY MOUTH EVERY DAY, Disp: 90 tablet, Rfl: 1   SYRINGE-NEEDLE, DISP, 3 ML (B-D 3CC LUER-LOK SYR 25GX1") 25G X 1" 3 ML MISC, Use with B12 shots, Disp: 10 each, Rfl: 1   Medications ordered in this encounter:  Meds ordered this encounter  Medications   promethazine-dextromethorphan (PROMETHAZINE-DM) 6.25-15 MG/5ML syrup    Sig: Take 5 mLs by mouth 4 (four) times daily as needed for cough.    Dispense:  240 mL    Refill:  0    Order Specific Question:   Supervising Provider    Answer:   Merrilee Jansky [[id number]]   predniSONE (DELTASONE) 20 MG tablet    Sig: Take 2 tablets (40 mg total) by mouth daily with breakfast for 5 days.    Dispense:  10 tablet    Refill:  0    Order Specific Question:   Supervising Provider    Answer:   Merrilee Jansky [id number]     *If you need refills on other medications prior to your next appointment, please contact your pharmacy*  Follow-Up: Call back or seek an in-person evaluation if the symptoms worsen or if the condition fails to improve as anticipated.  [facility] [phone]  Other Instructions INSTRUCTIONS: use a humidifier for nasal congestion Drink plenty of fluids, rest and  wash hands frequently to avoid the spread of infection Alternate tylenol and Motrin for relief of fever    If you have been instructed to have an in-person evaluation today at a local Urgent Care facility, please use the link below. It will take you to a list of all of our available [facility], including address, phone number and hours of operation. Please do not delay care.  [facility]  If you or a family member do not have a primary care provider, use the link below to schedule a visit and establish care. When you choose a [facility] primary care physician or advanced practice provider, you gain a  long-term partner in health. Find a Primary Care Provider  Learn more about [facility]'s in-office and virtual care options: [facility] - Get Care Now

## 2022-02-13 ENCOUNTER — Encounter: Payer: Self-pay | Admitting: Nurse Practitioner

## 2022-02-14 ENCOUNTER — Encounter: Payer: Self-pay | Admitting: Nurse Practitioner

## 2022-02-20 ENCOUNTER — Ambulatory Visit (INDEPENDENT_AMBULATORY_CARE_PROVIDER_SITE_OTHER): Payer: BC Managed Care – PPO | Admitting: Family Medicine

## 2022-02-20 ENCOUNTER — Encounter: Payer: Self-pay | Admitting: Family Medicine

## 2022-02-20 VITALS — BP 150/82 | HR 80 | Temp 98.4°F | Ht 66.0 in | Wt 207.5 lb

## 2022-02-20 DIAGNOSIS — J0100 Acute maxillary sinusitis, unspecified: Secondary | ICD-10-CM | POA: Diagnosis not present

## 2022-02-20 MED ORDER — AMOXICILLIN-POT CLAVULANATE 875-125 MG PO TABS: 1.0000 | ORAL_TABLET | Freq: Two times a day (BID) | ORAL | 0 refills | Status: DC

## 2022-02-20 NOTE — Progress Notes (Signed)
Ailes T. Captola Teschner, MD, [facility] at [facility] [place], [address]  Phone: [phone]  FAX: [phone]  Maria Lawson - 52 y.o. female  MRN [id number]  Date of Birth: 1970-09-24  Date: 02/20/2022  PCP: Tonia Ghent, MD  Referral: Tonia Ghent, MD  Chief Complaint  Patient presents with   Nasal Congestion    Started Wednesday after Christmas Did E-Visit on 02/12/22 and was prescribed prednsione   Shortness of Breath   Facial Pain   Cough   Subjective:   Maria Lawson is a 52 y.o. very pleasant female patient with Body mass index is 33.49 kg/m. who presents with the following:  Has been sick now for almost two weeks, wed after Christmas - 101 fever for three or four days.   Telehealth visit on 12/31 - got 5 days of pred forty mg.  At that time, she did not take a COVID test, she presumed that she had influenza.  Got over fever, etc, but sinus pressure and pain.  Since then the remainder of the congestion, cough, and polyarthralgia and arthralgia have completely resolved.  Right now her primary complaint is sinus pressure and pain in the frontal sinus and to a lesser extent maxillary sinus region.  She does not currently have a fever.  She is eating and drinking normally.  Review of Systems is noted in the HPI, as appropriate  Objective:   BP (!) 150/82   Pulse 80   Temp 98.4 F (36.9 C) (Oral)   Ht 5\' 6"  (1.676 m)   Wt 207 lb 8 oz (94.1 kg)   LMP 02/02/2022   SpO2 97%   BMI 33.49 kg/m    Gen: WDWN, NAD; alert,appropriate and cooperative throughout exam  HEENT: Normocephalic and atraumatic. Throat clear, w/o exudate, no LAD, R TM clear, L TM - good landmarks, No fluid present. rhinnorhea.  Left frontal and maxillary sinuses: Tender Right frontal and maxillary sinuses: Tender  Neck: No ant or post LAD CV: RRR, No M/G/R Pulm: Breathing comfortably in no resp distress. no  w/c/r Psych: full affect, pleasant   Laboratory and Imaging Data:  Assessment and Plan:     ICD-10-CM   1. Acute non-recurrent maxillary sinusitis  J01.00      Acute bacterial sinusitis.  Continue with supportive care, and treat as such with antibiotics.  Medication Management during today's office visit: Meds ordered this encounter  Medications   amoxicillin-clavulanate (AUGMENTIN) 875-125 MG tablet    Sig: Take 1 tablet by mouth 2 (two) times daily.    Dispense:  20 tablet    Refill:  0   Medications Discontinued During This Encounter  Medication Reason   ondansetron (ZOFRAN-ODT) 4 MG disintegrating tablet No longer needed (for PRN medications)   promethazine-dextromethorphan (PROMETHAZINE-DM) 6.25-15 MG/5ML syrup Prescription never filled    Orders placed today for conditions managed today: No orders of the defined types were placed in this encounter.   Disposition: No follow-ups on file.  Dragon [employer] One speech-to-text software was used for transcription in this dictation.  Possible transcriptional errors can occur using Editor, commissioning.   Signed,  Maria Deed. Mercedees Convery, MD   Outpatient Encounter Medications as of 02/20/2022  Medication Sig   acetaminophen (TYLENOL) 500 MG tablet Take 2 tablets (1,000 mg total) by mouth every 6 (six) hours as needed for mild pain.   amoxicillin-clavulanate (AUGMENTIN) 875-125 MG tablet Take 1 tablet by mouth 2 (  two) times daily.   cetirizine (ZYRTEC) 10 MG tablet Take 1 tablet (10 mg total) by mouth daily.   Cholecalciferol (VITAMIN D) 50 MCG (2000 UT) CAPS Take 1 capsule (2,000 Units total) by mouth daily.   cyanocobalamin (,VITAMIN B-12,) 1000 MCG/ML injection injected IM every 14 days.   Ferrous Sulfate (IRON PO) Take by mouth.   fluticasone (FLONASE) 50 MCG/ACT nasal spray Place 2 sprays into both nostrils daily.   Multiple Vitamin (MULTIVITAMIN ADULT PO) Take 1 tablet by mouth daily.   QUEtiapine (SEROQUEL) 25 MG  tablet TAKE 1/2 TO 1 TABLET BY MOUTH AT BEDTIME AS NEEDED FOR INSOMNIA   sertraline (ZOLOFT) 100 MG tablet TAKE 1 TABLET BY MOUTH EVERY DAY   SYRINGE-NEEDLE, DISP, 3 ML (B-D 3CC LUER-LOK SYR 25GX1") 25G X 1" 3 ML MISC Use with B12 shots   [DISCONTINUED] ondansetron (ZOFRAN-ODT) 4 MG disintegrating tablet Take 1 tablet (4 mg total) by mouth every 8 (eight) hours as needed for nausea or vomiting.   [DISCONTINUED] promethazine-dextromethorphan (PROMETHAZINE-DM) 6.25-15 MG/5ML syrup Take 5 mLs by mouth 4 (four) times daily as needed for cough.   No facility-administered encounter medications on file as of 02/20/2022.

## 2022-03-06 ENCOUNTER — Other Ambulatory Visit: Payer: Self-pay | Admitting: Family Medicine

## 2022-03-07 NOTE — Telephone Encounter (Signed)
Patient scheduled.

## 2022-03-07 NOTE — Telephone Encounter (Signed)
Patient is overdue for CPE; please call to schedule. 

## 2022-03-10 ENCOUNTER — Other Ambulatory Visit: Payer: BC Managed Care – PPO

## 2022-03-10 ENCOUNTER — Other Ambulatory Visit: Payer: Self-pay | Admitting: Family Medicine

## 2022-03-10 DIAGNOSIS — E538 Deficiency of other specified B group vitamins: Secondary | ICD-10-CM

## 2022-03-10 DIAGNOSIS — E785 Hyperlipidemia, unspecified: Secondary | ICD-10-CM

## 2022-03-10 DIAGNOSIS — D509 Iron deficiency anemia, unspecified: Secondary | ICD-10-CM

## 2022-03-17 ENCOUNTER — Encounter: Payer: BC Managed Care – PPO | Admitting: Family Medicine

## 2022-06-02 ENCOUNTER — Other Ambulatory Visit: Payer: Self-pay | Admitting: Family Medicine

## 2022-07-13 ENCOUNTER — Telehealth: Payer: Self-pay | Admitting: Family Medicine

## 2022-07-13 NOTE — Telephone Encounter (Signed)
Per access nurse note pt agreed to go to ED. Sending note to Dr Duncan and Duncan pool. 

## 2022-07-13 NOTE — Telephone Encounter (Signed)
Agree with ER eval.  Thanks.  

## 2022-07-13 NOTE — Telephone Encounter (Signed)
Pt called wondering if she needed to be triaged or schedule an appt. Pt states she still has periods & this period has been very heavy. Pt states yesterday, 5/29, she bled through her clothes twice. Pt states at the moment, she is wearing three pads to control the blood. Pt states she has never seen this much blood before. Transferred pt to access nurse. Call back # [phone]

## 2022-07-21 ENCOUNTER — Encounter: Payer: Self-pay | Admitting: Primary Care

## 2022-07-21 ENCOUNTER — Ambulatory Visit: Payer: 59 | Admitting: Primary Care

## 2022-07-21 VITALS — BP 146/80 | HR 90 | Temp 98.4°F | Ht 66.0 in | Wt 211.0 lb

## 2022-07-21 DIAGNOSIS — L237 Allergic contact dermatitis due to plants, except food: Secondary | ICD-10-CM | POA: Diagnosis not present

## 2022-07-21 MED ORDER — PREDNISONE 20 MG PO TABS: ORAL_TABLET | ORAL | 0 refills | Status: DC

## 2022-07-21 NOTE — Patient Instructions (Signed)
Start prednisone tablets for your itching and rash.  Take three tablets my mouth once daily in the morning for 3 days, then two tablets for 3 days, then one tablet for 3 days.  Be sure to wash all linens that may have come in contact with your gardening gloves.  It was a pleasure meeting you!

## 2022-07-21 NOTE — Assessment & Plan Note (Signed)
Presentation and symptoms today representative of poison ivy dermatitis.  There is no evidence of systemic reaction from IV site, IV tape. Reassurance provided.  Start prednisone course. Take three tablets my mouth once daily in the morning for 3 days, then two tablets for 3 days, then one tablet for 3 days.  Offered IM steroid today given her level of discomfort, she kindly declines. She will update if no improvement.

## 2022-07-21 NOTE — Progress Notes (Signed)
Subjective:    Patient ID: Maria Lawson, female    DOB: 02/18/1970, 52 y.o.   MRN: [id number]  Rash Pertinent negatives include no fever or shortness of breath.    Maria Lawson is a very pleasant 52 y.o. female patient of Dr. Para March with a history of anemia, B12 deficiency, anxiety, paresthesias who presents today to discuss rash.  She originally presented to the emergency department [facility] on 07/13/2022 for abnormal uterine bleeding.  An IV was placed.  Her rash began the following day at the IV site and has since been to her bilateral upper extremities and right lateral ankle. A few days ago she noticed the same type of rash to the left upper extremity and right lateral ankle.   She mentions an allergy to latex. She did do some yard work about 1 week prior to symptom onset, was pulling weeds.  She was wearing gardening gloves.  No new lotions, detergents, soaps or shampoos. No new medicines, vitamins, supplements. No new pets. No recent motel or hotel stay or new beds.   No fevers/chills, oral lesions, new joint pains, tick bites, abdominal pain, nausea.   No one else in her home has this rash or her symptoms. She's applied apple cider vinegar, calamine lotion, A&D itch ointment without improvement.   Review of Systems  Constitutional:  Negative for fever.  Respiratory:  Negative for shortness of breath.   Skin:  Positive for rash.         Past Medical History:  Diagnosis Date   Allergy 10/12/1998   Anemia    Anxiety 12/14/2004   Cholelithiasis    Migraine with aura    Panic attack     Social History   Socioeconomic History   Marital status: Divorced    Spouse name: Not on file   Number of children: 3   Years of education: Not on file   Highest education level: Not on file  Occupational History   Occupation: Former Emergency planning/management officer: UNEMPLOYED  Tobacco Use   Smoking status: Every Day    Packs/day: 1.00    Years: 20.00     Additional pack years: 0.00    Total pack years: 20.00    Types: E-cigarettes, Cigarettes   Smokeless tobacco: Never   Tobacco comments:    Cig smoke off and on since age 77.  Estimates 10 yrs NOT smoking during that time.(10/2020)  Vaping Use   Vaping Use: Some days  Substance and Sexual Activity   Alcohol use: Yes    Alcohol/week: 6.0 standard drinks of alcohol    Types: 6 Standard drinks or equivalent per week   Drug use: Yes    Types: Marijuana   Sexual activity: Not on file  Other Topics Concern   Not on file  Social History Narrative   Divorced.    3 kids   Social Determinants of Corporate investment banker Strain: Not on file  Food Insecurity: Not on file  Transportation Needs: Not on file  Physical Activity: Not on file  Stress: Not on file  Social Connections: Not on file  Intimate Partner Violence: Not on file    Past Surgical History:  Procedure Laterality Date   APPENDECTOMY  1987   VAGINAL DELIVERY      Family History  Problem Relation Age of Onset   Hyperlipidemia Mother    Breast cancer Mother    Hypertension Father    Hyperlipidemia Father  Memory loss Father    Colon cancer Maternal Uncle     Allergies  Allergen Reactions   Cashew Nut (Anacardium Occidentale) Skin Test     Tongue felt thick   Latex     REACTION: rash (from tape used during epidural)    Current Outpatient Medications on File Prior to Visit  Medication Sig Dispense Refill   acetaminophen (TYLENOL) 500 MG tablet Take 2 tablets (1,000 mg total) by mouth every 6 (six) hours as needed for mild pain.     cetirizine (ZYRTEC) 10 MG tablet Take 1 tablet (10 mg total) by mouth daily.     Cholecalciferol (VITAMIN D) 50 MCG (2000 UT) CAPS Take 1 capsule (2,000 Units total) by mouth daily.     cyanocobalamin (,VITAMIN B-12,) 1000 MCG/ML injection injected IM every 14 days. 6 mL 1   Ferrous Sulfate (IRON PO) Take by mouth.     fluticasone (FLONASE) 50 MCG/ACT nasal spray Place  2 sprays into both nostrils daily.     Multiple Vitamin (MULTIVITAMIN ADULT PO) Take 1 tablet by mouth daily.     QUEtiapine (SEROQUEL) 25 MG tablet TAKE 1/2 TO 1 TABLET BY MOUTH AT BEDTIME AS NEEDED FOR INSOMNIA 90 tablet 0   sertraline (ZOLOFT) 100 MG tablet TAKE 1 TABLET BY MOUTH EVERY DAY 90 tablet 1   SYRINGE-NEEDLE, DISP, 3 ML (B-D 3CC LUER-LOK SYR 25GX1") 25G X 1" 3 ML MISC Use with B12 shots 10 each 1   amoxicillin-clavulanate (AUGMENTIN) 875-125 MG tablet Take 1 tablet by mouth 2 (two) times daily. (Patient not taking: Reported on 07/21/2022) 20 tablet 0   No current facility-administered medications on file prior to visit.    BP (!) 146/80   Pulse 90   Temp 98.4 F (36.9 C) (Temporal)   Ht 5\' 6"  (1.676 m)   Wt 211 lb (95.7 kg)   LMP 07/11/2022 (Exact Date)   SpO2 97%   BMI 34.06 kg/m  Objective:   Physical Exam Constitutional:      General: She is not in acute distress. Skin:    General: Skin is warm and dry.     Findings: Rash present.     Comments: Several dried vesicles to right and left antecubital fossa. Scabbed rash to right lateral ankle.   Neurological:     Mental Status: She is alert.           Assessment & Plan:  Poison ivy dermatitis Assessment & Plan: Presentation and symptoms today representative of poison ivy dermatitis.  There is no evidence of systemic reaction from IV site, IV tape. Reassurance provided.  Start prednisone course. Take three tablets my mouth once daily in the morning for 3 days, then two tablets for 3 days, then one tablet for 3 days.  Offered IM steroid today given her level of discomfort, she kindly declines. She will update if no improvement.  Orders: -     predniSONE; Take three tablets my mouth once daily in the morning for 3 days, then two tablets for 3 days, then one tablet for 3 days.  Dispense: 18 tablet; Refill: 0        Doreene Nest, NP

## 2023-01-10 ENCOUNTER — Other Ambulatory Visit: Payer: Self-pay | Admitting: Family Medicine

## 2023-01-10 NOTE — Telephone Encounter (Signed)
Refill request for sertraline (ZOLOFT) 100 MG tablet   LOV - 09/05/21 Next OV - not scheduled Last refill - 06/02/22 #90/1

## 2023-04-12 ENCOUNTER — Other Ambulatory Visit: Payer: Self-pay | Admitting: Family Medicine

## 2023-04-13 NOTE — Telephone Encounter (Signed)
 Refill for sertraline sent for 30 days. She is past due for OV with Dr Para March for depression. Please help her get scheduled. Thank you.

## 2023-04-13 NOTE — Telephone Encounter (Signed)
 Patient has been scheduled

## 2023-04-20 ENCOUNTER — Ambulatory Visit: Payer: Self-pay | Admitting: Family Medicine

## 2023-05-28 ENCOUNTER — Telehealth: Payer: Self-pay | Admitting: Family

## 2023-05-28 NOTE — Telephone Encounter (Signed)
 Pt n my schedule tomorrow 4/15 for medication refill.  Needs to see pcp rather than I for this concern.  Please reschedule with Dr. Vallarie Gauze.

## 2023-05-28 NOTE — Telephone Encounter (Signed)
 Spoke to pt. Dr. Vallarie Gauze is out of office for the remaining of the week. Added pt to Cable's schedule. Pt wanted virtual visit along with med refill but [place]. Appt cancelled.

## 2023-05-29 ENCOUNTER — Ambulatory Visit: Payer: Self-pay | Admitting: Family

## 2023-05-30 ENCOUNTER — Ambulatory Visit: Payer: Self-pay | Admitting: Nurse Practitioner

## 2023-05-30 ENCOUNTER — Other Ambulatory Visit: Payer: Self-pay | Admitting: Family Medicine

## 2023-12-24 ENCOUNTER — Ambulatory Visit: Payer: Self-pay

## 2023-12-24 NOTE — Telephone Encounter (Signed)
 Noted. Agree with nursing triage decision. Will evaluate.

## 2023-12-24 NOTE — Telephone Encounter (Signed)
 FYI Only or Action Required?: FYI only for provider: appointment scheduled on 12/25/23.  Patient was last seen in primary care on 07/21/2022 by Maria Comer POUR, NP.  Called Nurse Triage reporting Dizziness.  Symptoms began a week ago.  Interventions attempted: OTC medications: Flonase and Zyrtec .  Symptoms are: stable.  Triage Disposition: See Physician Within 24 Hours  Patient/caregiver understands and will follow disposition?: Yes                            Copied from CRM #[id number]. Topic: Clinical - Red Word Triage >> Dec 24, 2023  8:35 AM Maria Lawson wrote: Red Word that prompted transfer to Nurse Triage: dizziness and blood pressure medicaiton. took last one today. [place] state and [place] care of mom. Reason for Disposition  Taking a medicine that could cause dizziness (e.g., blood pressure medications, diuretics)  Answer Assessment - Initial Assessment Questions 1. DESCRIPTION: Describe your dizziness.     Swimmy head, states sensation feels internal  2. LIGHTHEADED: Do you feel lightheaded? (e.g., somewhat faint, woozy, weak upon standing)     Denies fainting, denies falls, denies feeling faint 3. VERTIGO: Do you feel like either you or the room is spinning or tilting? (i.e., vertigo)     Denies 4. SEVERITY: How bad is it?  Do you feel like you are going to faint? Can you stand and walk?     States she is able to stand and walk normally  5. ONSET:  When did the dizziness begin?     On and off since last week  6. AGGRAVATING FACTORS: Does anything make it worse? (e.g., standing, change in head position)     Denies 7. HEART RATE: Can you tell me your heart rate? How many beats in 15 seconds?  (Note: Not all patients can do this.)       N/A 8. CAUSE: What do you think is causing the dizziness? (e.g., decreased fluids or food, diarrhea, emotional distress, heat exposure, new medicine, sudden standing, vomiting; unknown)      Unsure, states she worked 70 hours last night, states was treated for elevated BP when she was out of town in [place] and started losartan 25 MG (May) 9. RECURRENT SYMPTOM: Have you had dizziness before? If Yes, ask: When was the last time? What happened that time?     Occasionally, unsure of last episode  10. OTHER SYMPTOMS: Do you have any other symptoms? (e.g., fever, chest pain, vomiting, diarrhea, bleeding)     General weakness related to fatigue, denies severe headache, denies changes in vision, denies difficulty breathing, denies chest pain 11. PREGNANCY: Is there any chance you are pregnant? When was your last menstrual period?     N/A  Protocols used: Dizziness - Lightheadedness-A-AH

## 2023-12-25 ENCOUNTER — Encounter: Payer: Self-pay | Admitting: Primary Care

## 2023-12-25 ENCOUNTER — Ambulatory Visit (INDEPENDENT_AMBULATORY_CARE_PROVIDER_SITE_OTHER): Admitting: Primary Care

## 2023-12-25 VITALS — BP 164/84 | HR 106 | Temp 98.2°F | Ht 66.0 in | Wt 210.5 lb

## 2023-12-25 DIAGNOSIS — R42 Dizziness and giddiness: Secondary | ICD-10-CM | POA: Diagnosis not present

## 2023-12-25 DIAGNOSIS — F411 Generalized anxiety disorder: Secondary | ICD-10-CM

## 2023-12-25 DIAGNOSIS — I10 Essential (primary) hypertension: Secondary | ICD-10-CM | POA: Diagnosis not present

## 2023-12-25 MED ORDER — LOSARTAN POTASSIUM 25 MG PO TABS: 25.0000 mg | ORAL_TABLET | Freq: Every day | ORAL | 0 refills | Status: DC

## 2023-12-25 MED ORDER — SERTRALINE HCL 50 MG PO TABS: 50.0000 mg | ORAL_TABLET | Freq: Every day | ORAL | 0 refills | Status: DC

## 2023-12-25 NOTE — Assessment & Plan Note (Signed)
 Above goal today.  Resume losartan 25 mg daily.  Refill sent to pharmacy. Recommend she set up follow-up with PCP.

## 2023-12-25 NOTE — Telephone Encounter (Signed)
 Noted. Thanks.

## 2023-12-25 NOTE — Progress Notes (Signed)
 Subjective:    Patient ID: Maria Lawson, female    DOB: 1971/02/10, 53 y.o.   MRN: [id number]  Maria Lawson is a very pleasant 53 y.o. female patient of Dr. Cleatus with a history of anxiety, hypertension, iron  deficiency anemia, B12 deficiency who presents today to discuss dizziness. multiple concerns.  Acute for the last 2 months, intermittently. This began once she ran out of her sertraline  100 mg. She had to quit suddenly as she ran out of refills. She's not been able to come in for a follow up visit as she's been caring for her mother who recently passed from breast cancer.   Since off Zoloft  her anxiety has increased significantly. She has noticed improved motivation to do things for which was present while taking Zoloft . She's tried Paxil, Wellbutrin, and Buspar in the past which were ineffective or intolerable.   She has yet to take her losartan today as she's completely out of medication. She does track her BP at home which runns 110s-120s/70-80s.  She has believe that anxiety is playing a role in her elevated blood pressure readings.  She also believes she is going to menopause  BP Readings from Last 3 Encounters:  12/25/23 (!) 164/84  07/21/22 (!) 146/80  02/20/22 (!) 150/82     Review of Systems  Respiratory:  Negative for shortness of breath.   Cardiovascular:  Negative for chest pain.  Neurological:  Positive for dizziness and light-headedness.  Psychiatric/Behavioral:  Negative for suicidal ideas. The patient is nervous/anxious.          Past Medical History:  Diagnosis Date   Allergy 10/12/1998   Anemia    Anxiety 12/14/2004   Cholelithiasis    Migraine with aura    Panic attack     Social History   Socioeconomic History   Marital status: Divorced    Spouse name: Not on file   Number of children: 3   Years of education: Not on file   Highest education level: Not on file  Occupational History   Occupation: Former Emergency Planning/management Officer:  UNEMPLOYED  Tobacco Use   Smoking status: Every Day    Current packs/day: 1.00    Average packs/day: 1 pack/day for 20.0 years (20.0 ttl pk-yrs)    Types: E-cigarettes, Cigarettes   Smokeless tobacco: Never   Tobacco comments:    Cig smoke off and on since age 20.  Estimates 10 yrs NOT smoking during that time.(10/2020)  Vaping Use   Vaping status: Some Days  Substance and Sexual Activity   Alcohol use: Yes    Alcohol/week: 6.0 standard drinks of alcohol    Types: 6 Standard drinks or equivalent per week   Drug use: Yes    Types: Marijuana   Sexual activity: Not on file  Other Topics Concern   Not on file  Social History Narrative   Divorced.    3 kids   Social Drivers of Corporate Investment Banker Strain: Not on file  Food Insecurity: Not on file  Transportation Needs: Not on file  Physical Activity: Not on file  Stress: Not on file  Social Connections: Not on file  Intimate Partner Violence: Not on file    Past Surgical History:  Procedure Laterality Date   APPENDECTOMY  1987   VAGINAL DELIVERY      Family History  Problem Relation Age of Onset   Hyperlipidemia Mother    Breast cancer Mother    Hypertension Father  Hyperlipidemia Father    Memory loss Father    Colon cancer Maternal Uncle     Allergies  Allergen Reactions   Cashew Nut (Anacardium Occidentale) Skin Test     Tongue felt thick   Latex     REACTION: rash (from tape used during epidural)    Current Outpatient Medications on File Prior to Visit  Medication Sig Dispense Refill   acetaminophen  (TYLENOL ) 500 MG tablet Take 2 tablets (1,000 mg total) by mouth every 6 (six) hours as needed for mild pain.     cetirizine  (ZYRTEC ) 10 MG tablet Take 1 tablet (10 mg total) by mouth daily.     Cholecalciferol (VITAMIN D ) 50 MCG (2000 UT) CAPS Take 1 capsule (2,000 Units total) by mouth daily.     cyanocobalamin  (,VITAMIN B-12,) 1000 MCG/ML injection 1000mcg injected IM every 14 days. 6 mL 1    Ferrous Sulfate (IRON  PO) Take by mouth.     fluticasone (FLONASE) 50 MCG/ACT nasal spray Place 2 sprays into both nostrils daily.     Multiple Vitamin (MULTIVITAMIN ADULT PO) Take 1 tablet by mouth daily.     SYRINGE-NEEDLE, DISP, 3 ML (B-D 3CC LUER-LOK SYR 25GX1) 25G X 1 3 ML MISC Use with B12 shots 10 each 1   No current facility-administered medications on file prior to visit.    BP (!) 164/84   Pulse (!) 106   Temp 98.2 F (36.8 C) (Oral)   Ht 5' 6 (1.676 m)   Wt 210 lb 8 oz (95.5 kg)   LMP 10/11/2023 (Approximate)   SpO2 99%   BMI 33.98 kg/m  Objective:   Physical Exam Cardiovascular:     Rate and Rhythm: Normal rate and regular rhythm.  Pulmonary:     Effort: Pulmonary effort is normal.     Breath sounds: Normal breath sounds.  Musculoskeletal:     Cervical back: Neck supple.  Skin:    General: Skin is warm and dry.  Neurological:     Mental Status: She is alert and oriented to person, place, and time.  Psychiatric:        Mood and Affect: Mood normal.     Physical Exam        Assessment & Plan:  GAD (generalized anxiety disorder) Assessment & Plan: Uncontrolled since abrupt discontinuation of SSRI.  Resume sertraline  25 mg daily x 1 week, then increase to 50 mg daily thereafter. We discussed continuing 50 mg to see if this is enough to control anxiety but not make her feel slightly depressed.  If her depression returns, would consider Lexapro.  She has failed BuSpar, Paxil, Wellbutrin.  Follow-up with PCP  Orders: -     Sertraline  HCl; Take 1 tablet (50 mg total) by mouth daily. for anxiety  Dispense: 90 tablet; Refill: 0  Primary hypertension Assessment & Plan: Above goal today.  Resume losartan 25 mg daily.  Refill sent to pharmacy. Recommend she set up follow-up with PCP.  Orders: -     Losartan Potassium; Take 1 tablet (25 mg total) by mouth daily. for blood pressure.  Dispense: 90 tablet; Refill: 0  Dizziness Assessment &  Plan: Seems related to abrupt discontinuation of SSRI and uncontrolled anxiety. Start by resuming sertraline  to see if symptoms improve/resolve. Will also resume losartan as blood pressure could be contributing.  If no improvement then she will discuss with PCP.     Assessment and Plan Assessment & Plan         Comer  MARLA Gaskins, NP    History of Present Illness

## 2023-12-25 NOTE — Assessment & Plan Note (Signed)
 Uncontrolled since abrupt discontinuation of SSRI.  Resume sertraline  25 mg daily x 1 week, then increase to 50 mg daily thereafter. We discussed continuing 50 mg to see if this is enough to control anxiety but not make her feel slightly depressed.  If her depression returns, would consider Lexapro.  She has failed BuSpar, Paxil, Wellbutrin.  Follow-up with PCP

## 2023-12-25 NOTE — Patient Instructions (Signed)
 Resume sertraline  50 mg tablets.  Start with 1/2 tablet daily x 1 week, then increase to 1 full tablet daily thereafter.  Resume your losartan blood pressure pill daily  Schedule a physical with Dr. Cleatus.  It was a pleasure to see you today!

## 2023-12-25 NOTE — Assessment & Plan Note (Signed)
 Seems related to abrupt discontinuation of SSRI and uncontrolled anxiety. Start by resuming sertraline  to see if symptoms improve/resolve. Will also resume losartan as blood pressure could be contributing.  If no improvement then she will discuss with PCP.

## 2024-01-08 ENCOUNTER — Other Ambulatory Visit (INDEPENDENT_AMBULATORY_CARE_PROVIDER_SITE_OTHER)

## 2024-01-08 DIAGNOSIS — E785 Hyperlipidemia, unspecified: Secondary | ICD-10-CM | POA: Diagnosis not present

## 2024-01-08 DIAGNOSIS — E538 Deficiency of other specified B group vitamins: Secondary | ICD-10-CM | POA: Diagnosis not present

## 2024-01-08 DIAGNOSIS — D509 Iron deficiency anemia, unspecified: Secondary | ICD-10-CM | POA: Diagnosis not present

## 2024-01-08 LAB — CBC WITH DIFFERENTIAL/PLATELET
Basophils Absolute: 0 K/uL (ref 0.0–0.1)
Basophils Relative: 0.5 % (ref 0.0–3.0)
Eosinophils Absolute: 0.1 K/uL (ref 0.0–0.7)
Eosinophils Relative: 1.9 % (ref 0.0–5.0)
HCT: 41.6 % (ref 36.0–46.0)
Hemoglobin: 13.6 g/dL (ref 12.0–15.0)
Lymphocytes Relative: 17.4 % (ref 12.0–46.0)
Lymphs Abs: 1.2 K/uL (ref 0.7–4.0)
MCHC: 32.8 g/dL (ref 30.0–36.0)
MCV: 83.1 fl (ref 78.0–100.0)
Monocytes Absolute: 0.5 K/uL (ref 0.1–1.0)
Monocytes Relative: 6.7 % (ref 3.0–12.0)
Neutro Abs: 5 K/uL (ref 1.4–7.7)
Neutrophils Relative %: 73.5 % (ref 43.0–77.0)
Platelets: 229 K/uL (ref 150.0–400.0)
RBC: 5 Mil/uL (ref 3.87–5.11)
RDW: 15.1 % (ref 11.5–15.5)
WBC: 6.8 K/uL (ref 4.0–10.5)

## 2024-01-08 LAB — LIPID PANEL
Cholesterol: 213 mg/dL — ABNORMAL HIGH (ref 0–200)
HDL: 60 mg/dL (ref >39.00)
LDL Cholesterol: 122 mg/dL — ABNORMAL HIGH (ref 0–99)
NonHDL: 152.93
Total CHOL/HDL Ratio: 4
Triglycerides: 154 mg/dL — ABNORMAL HIGH (ref 0.0–149.0)
VLDL: 30.8 mg/dL (ref 0.0–40.0)

## 2024-01-08 LAB — COMPREHENSIVE METABOLIC PANEL WITH GFR
ALT: 15 U/L (ref 0–35)
AST: 12 U/L (ref 0–37)
Albumin: 4.6 g/dL (ref 3.5–5.2)
Alkaline Phosphatase: 62 U/L (ref 39–117)
BUN: 12 mg/dL (ref 6–23)
CO2: 30 meq/L (ref 19–32)
Calcium: 9.4 mg/dL (ref 8.4–10.5)
Chloride: 102 meq/L (ref 96–112)
Creatinine, Ser: 0.73 mg/dL (ref 0.40–1.20)
GFR: 95.06 mL/min (ref >60.00)
Glucose, Bld: 92 mg/dL (ref 70–99)
Potassium: 4.5 meq/L (ref 3.5–5.1)
Sodium: 138 meq/L (ref 135–145)
Total Bilirubin: 0.4 mg/dL (ref 0.2–1.2)
Total Protein: 6.6 g/dL (ref 6.0–8.3)

## 2024-01-08 LAB — VITAMIN B12: Vitamin B-12: 159 pg/mL — ABNORMAL LOW (ref 211–911)

## 2024-01-08 LAB — TSH: TSH: 1.29 u[IU]/mL (ref 0.35–5.50)

## 2024-01-08 LAB — IRON: Iron: 50 ug/dL (ref 42–145)

## 2024-01-13 ENCOUNTER — Ambulatory Visit: Payer: Self-pay | Admitting: Family Medicine

## 2024-01-18 ENCOUNTER — Encounter: Admitting: Family Medicine

## 2024-01-31 ENCOUNTER — Encounter: Payer: Self-pay | Admitting: Family Medicine

## 2024-01-31 ENCOUNTER — Ambulatory Visit: Admitting: Family Medicine

## 2024-01-31 VITALS — BP 138/82 | HR 83 | Temp 98.5°F | Ht 65.0 in | Wt 208.5 lb

## 2024-01-31 DIAGNOSIS — Z7189 Other specified counseling: Secondary | ICD-10-CM

## 2024-01-31 DIAGNOSIS — I10 Essential (primary) hypertension: Secondary | ICD-10-CM

## 2024-01-31 DIAGNOSIS — Z0000 Encounter for general adult medical examination without abnormal findings: Secondary | ICD-10-CM | POA: Diagnosis not present

## 2024-01-31 DIAGNOSIS — Z1211 Encounter for screening for malignant neoplasm of colon: Secondary | ICD-10-CM

## 2024-01-31 DIAGNOSIS — E538 Deficiency of other specified B group vitamins: Secondary | ICD-10-CM

## 2024-01-31 DIAGNOSIS — F411 Generalized anxiety disorder: Secondary | ICD-10-CM

## 2024-01-31 MED ORDER — BD LUER-LOK SYRINGE 25G X 1" 3 ML MISC: 1 refills | Status: AC

## 2024-01-31 MED ORDER — LOSARTAN POTASSIUM 25 MG PO TABS: 12.5000 mg | ORAL_TABLET | Freq: Every day | ORAL | Status: DC

## 2024-01-31 MED ORDER — SERTRALINE HCL 50 MG PO TABS: 50.0000 mg | ORAL_TABLET | Freq: Every day | ORAL | 3 refills | Status: AC

## 2024-01-31 MED ORDER — CYANOCOBALAMIN 1000 MCG/ML IJ SOLN: INTRAMUSCULAR | 3 refills | Status: AC

## 2024-01-31 NOTE — Patient Instructions (Addendum)
 You could try breaking losartan  in half and see if you feel better.  Keep checking your BP at home.  Goal BP is <140/90.   You can call for a mammogram at [facility] at [facility].  [address] floor) [place] [phone]   I would restart monthly B12 shots.  Let me know if that isn't helping.   Take care.  Glad to see you.  Try icing for 5 minutes at a time and see if that helps.

## 2024-01-31 NOTE — Progress Notes (Unsigned)
 CPE- See plan.  Routine anticipatory guidance given to patient.  See health maintenance.  The possibility exists that previously documented standard health maintenance information may have been brought forward from a previous encounter into this note.  If needed, that same information has been updated to reflect the current situation based on today's encounter.    Mammogram due, d/w pt. See AVS.  DXA not due. Refer for colonoscopy.  Oldest son Shamekia Tippets designated if patient were incapacitated.  Tetanus 2017 Flu encouraged.   Shingles covid and PNA d/w pt.   She is having diffuse joint pains, hips knees elbows hands.  At the L greater trochanter and R medial epicondyle.    Her mother passed this year.  Condolences offered.   Low B12.  Had been off replacement for B12 and vit D.  Not taking iron .   Hypertension:    Using medication without problems or lightheadedness: occ lightheaded, cautions d/w pt.   Chest pain with exertion:no Edema:no Short of breath:no Labs d/w pt.    Anxiety.  Taking 50mg  sertraline .  It helped with anxiety, less with depression.  D/w pt about B12 level.    PMH and SH reviewed  Meds, vitals, and allergies reviewed.   ROS: Per HPI.  Unless specifically indicated otherwise in HPI, the patient denies:  General: fever. Eyes: acute vision changes ENT: sore throat Cardiovascular: chest pain Respiratory: SOB GI: vomiting GU: dysuria Musculoskeletal: acute back pain Derm: acute rash Neuro: acute motor dysfunction Psych: worsening mood Endocrine: polydipsia Heme: bleeding Allergy: hayfever  GEN: nad, alert and oriented HEENT: mucous membranes moist NECK: supple w/o LA CV: rrr. PULM: ctab, no inc wob ABD: soft, +bs EXT: no edema SKIN: no acute rash

## 2024-02-05 NOTE — Assessment & Plan Note (Signed)
 Oldest son Rayanna Matusik designated if patient were incapacitated.

## 2024-02-05 NOTE — Assessment & Plan Note (Signed)
 Mammogram due, d/w pt. See AVS.  DXA not due. Refer for colonoscopy.  Oldest son Maria Lawson designated if patient were incapacitated.  Tetanus 2017 Flu encouraged.   Shingles covid and PNA d/w pt.  Routine vaccination encouraged.

## 2024-02-05 NOTE — Assessment & Plan Note (Signed)
 She can restart monthly B12 shots and let me know if she does not feel better in terms of her joint pain.

## 2024-02-05 NOTE — Assessment & Plan Note (Signed)
 She could try breaking losartan  in half and see if she feels better.  Keep checking your BP at home.  Goal BP is <140/90.

## 2024-02-05 NOTE — Assessment & Plan Note (Signed)
 Taking 50mg  sertraline .  It helped with anxiety, less with depression.  D/w pt about B12 replacement.

## 2024-03-19 ENCOUNTER — Other Ambulatory Visit: Payer: Self-pay | Admitting: Primary Care

## 2024-03-19 DIAGNOSIS — I10 Essential (primary) hypertension: Secondary | ICD-10-CM
